# Patient Record
Sex: Female | Born: 1970 | Race: White | Hispanic: No | Marital: Married | Smoking: Never smoker
Health system: Southern US, Community
[De-identification: ages and names within clinical notes are randomized; demographics above are authoritative.]

## PROBLEM LIST (undated history)

## (undated) DIAGNOSIS — Z86718 Personal history of other venous thrombosis and embolism: Secondary | ICD-10-CM

## (undated) DIAGNOSIS — M109 Gout, unspecified: Secondary | ICD-10-CM

## (undated) DIAGNOSIS — I10 Essential (primary) hypertension: Secondary | ICD-10-CM

## (undated) DIAGNOSIS — E079 Disorder of thyroid, unspecified: Secondary | ICD-10-CM

## (undated) DIAGNOSIS — I639 Cerebral infarction, unspecified: Secondary | ICD-10-CM

## (undated) DIAGNOSIS — D649 Anemia, unspecified: Secondary | ICD-10-CM

## (undated) HISTORY — DX: Disorder of thyroid, unspecified: E07.9

## (undated) HISTORY — PX: ESOPHAGOGASTRODUODENOSCOPY: SHX1529

## (undated) HISTORY — DX: Cerebral infarction, unspecified: I63.9

## (undated) HISTORY — DX: Essential (primary) hypertension: I10

## (undated) HISTORY — DX: Anemia, unspecified: D64.9

## (undated) HISTORY — DX: Personal history of other venous thrombosis and embolism: Z86.718

## (undated) HISTORY — PX: COLONOSCOPY: SHX174

---

## 2005-08-07 ENCOUNTER — Ambulatory Visit: Payer: Self-pay | Admitting: Family Medicine

## 2006-11-13 ENCOUNTER — Ambulatory Visit: Payer: Self-pay | Admitting: Family Medicine

## 2007-11-21 ENCOUNTER — Other Ambulatory Visit: Payer: Self-pay

## 2007-11-21 ENCOUNTER — Inpatient Hospital Stay: Payer: Self-pay | Admitting: Internal Medicine

## 2007-11-23 ENCOUNTER — Ambulatory Visit: Payer: Self-pay | Admitting: Oncology

## 2007-12-04 ENCOUNTER — Encounter: Payer: Self-pay | Admitting: Family Medicine

## 2007-12-21 ENCOUNTER — Encounter: Payer: Self-pay | Admitting: Family Medicine

## 2007-12-21 ENCOUNTER — Ambulatory Visit: Payer: Self-pay | Admitting: Oncology

## 2008-03-19 ENCOUNTER — Ambulatory Visit: Payer: Self-pay | Admitting: Family Medicine

## 2008-04-16 ENCOUNTER — Ambulatory Visit: Payer: Self-pay | Admitting: Family Medicine

## 2008-08-07 ENCOUNTER — Emergency Department: Payer: Self-pay | Admitting: Unknown Physician Specialty

## 2009-08-25 ENCOUNTER — Ambulatory Visit: Payer: Self-pay | Admitting: Family Medicine

## 2009-10-28 ENCOUNTER — Emergency Department: Payer: Self-pay | Admitting: Emergency Medicine

## 2011-04-13 ENCOUNTER — Ambulatory Visit: Payer: Self-pay | Admitting: Family Medicine

## 2011-06-28 DIAGNOSIS — Z9229 Personal history of other drug therapy: Secondary | ICD-10-CM | POA: Insufficient documentation

## 2013-06-16 ENCOUNTER — Ambulatory Visit: Payer: Self-pay

## 2013-09-15 DIAGNOSIS — N390 Urinary tract infection, site not specified: Secondary | ICD-10-CM | POA: Insufficient documentation

## 2014-07-02 ENCOUNTER — Encounter: Payer: Self-pay | Admitting: Podiatry

## 2014-07-02 ENCOUNTER — Ambulatory Visit (INDEPENDENT_AMBULATORY_CARE_PROVIDER_SITE_OTHER): Payer: BC Managed Care – PPO

## 2014-07-02 ENCOUNTER — Ambulatory Visit (INDEPENDENT_AMBULATORY_CARE_PROVIDER_SITE_OTHER): Payer: BC Managed Care – PPO | Admitting: Podiatry

## 2014-07-02 VITALS — BP 123/77 | HR 76 | Resp 16 | Ht 62.0 in | Wt 265.0 lb

## 2014-07-02 DIAGNOSIS — M21619 Bunion of unspecified foot: Secondary | ICD-10-CM

## 2014-07-02 DIAGNOSIS — I2699 Other pulmonary embolism without acute cor pulmonale: Secondary | ICD-10-CM | POA: Insufficient documentation

## 2014-07-02 DIAGNOSIS — I639 Cerebral infarction, unspecified: Secondary | ICD-10-CM | POA: Insufficient documentation

## 2014-07-02 DIAGNOSIS — E785 Hyperlipidemia, unspecified: Secondary | ICD-10-CM | POA: Insufficient documentation

## 2014-07-02 DIAGNOSIS — M2021 Hallux rigidus, right foot: Secondary | ICD-10-CM

## 2014-07-02 DIAGNOSIS — M2020 Hallux rigidus, unspecified foot: Secondary | ICD-10-CM

## 2014-07-02 DIAGNOSIS — I10 Essential (primary) hypertension: Secondary | ICD-10-CM | POA: Insufficient documentation

## 2014-07-02 DIAGNOSIS — M21611 Bunion of right foot: Secondary | ICD-10-CM

## 2014-07-02 DIAGNOSIS — E079 Disorder of thyroid, unspecified: Secondary | ICD-10-CM | POA: Insufficient documentation

## 2014-07-02 DIAGNOSIS — M779 Enthesopathy, unspecified: Secondary | ICD-10-CM

## 2014-07-02 MED ORDER — TRIAMCINOLONE ACETONIDE 10 MG/ML IJ SUSP: 10.0000 mg | Freq: Once | INTRAMUSCULAR | Status: DC

## 2014-07-02 NOTE — Progress Notes (Signed)
Subjective:     Patient ID: Caitlyn Elliott, female   DOB: 05-25-1971, 43 y.o.   MRN: [id number]  HPI patient states she's having a lot of pain in the big toe joint of her right foot for 6 weeks and that she caught her toe and her husband shorts and it bent backwards and it's been sore ever since. States that she's had a history of a blood clot in her long 2004 and stroke in 2005 which have resolved and she currently takes Coumadin 5 mg a day to control   Review of Systems  All other systems reviewed and are negative.      Objective:   Physical Exam  Nursing note and vitals reviewed. Constitutional: She is oriented to person, place, and time.  Cardiovascular: Intact distal pulses.   Musculoskeletal: Normal range of motion.  Neurological: She is oriented to person, place, and time.  Skin: Skin is warm.   neurovascular status intact with muscle strength adequate and range of motion subtalar and midtarsal joint within normal limits. Moderate reduction of motion first MPJ right with pain within the joint and inflammation and fluid around mostly the medial side but also the dorsal central and the lateral portion of the joint are sore when pressed. 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 Reappoint in 2 weeks to reevaluate and may require other treatment or possible orthotics

## 2014-07-02 NOTE — Progress Notes (Signed)
   Subjective:    Patient ID: Caitlyn Elliott, female    DOB: 1971/01/19, 43 y.o.   MRN: [id number]  HPI Comments: At the end of July I had a injury to my toe, my toe got caught up on husbands shorts and bent the wrong way ,  and it has not got better. It has its good days and bad days.  Right hallux , it swells throughout the day . It even hurts when not walking. Ice it at night      Review of Systems  Hematological: Bruises/bleeds easily.  All other systems reviewed and are negative.      Objective:   Physical Exam        Assessment & Plan:

## 2014-07-16 ENCOUNTER — Ambulatory Visit (INDEPENDENT_AMBULATORY_CARE_PROVIDER_SITE_OTHER): Payer: BC Managed Care – PPO | Admitting: Podiatry

## 2014-07-16 VITALS — BP 120/74 | HR 84 | Resp 16

## 2014-07-16 DIAGNOSIS — M779 Enthesopathy, unspecified: Secondary | ICD-10-CM

## 2014-07-16 DIAGNOSIS — M21611 Bunion of right foot: Secondary | ICD-10-CM

## 2014-07-16 DIAGNOSIS — M21619 Bunion of unspecified foot: Secondary | ICD-10-CM

## 2014-07-16 NOTE — Progress Notes (Signed)
Subjective:     Patient ID: Caitlyn Elliott, female   DOB: [birth year], 43 y.o.   MRN: [id number]  HPI patient states my right foot is feeling a lot better with mild discomfort with pressure  Review of Systems     Objective:   Physical Exam Neurovascular status unchanged with mild redness around the first metatarsal head right significantly improved from previous    Assessment:     Inflammatory capsulitis around the first MPJ which has responded to conservative care    Plan:     Instructed patient on soaks and wider shoes patient will be seen back as needed

## 2014-08-16 ENCOUNTER — Ambulatory Visit: Payer: Self-pay | Admitting: Family Medicine

## 2014-09-08 ENCOUNTER — Ambulatory Visit: Payer: Self-pay | Admitting: Obstetrics and Gynecology

## 2014-09-08 LAB — CBC WITH DIFFERENTIAL/PLATELET
Basophil #: 0 10*3/uL (ref 0.0–0.1)
Basophil %: 0.5 %
Eosinophil #: 0.1 10*3/uL (ref 0.0–0.7)
Eosinophil %: 0.9 %
HCT: 38.5 % (ref 35.0–47.0)
HGB: 12.2 g/dL (ref 12.0–16.0)
Lymphocyte #: 2.2 10*3/uL (ref 1.0–3.6)
Lymphocyte %: 31.6 %
MCH: 23.5 pg — ABNORMAL LOW (ref 26.0–34.0)
MCHC: 31.6 g/dL — ABNORMAL LOW (ref 32.0–36.0)
MCV: 74 fL — ABNORMAL LOW (ref 80–100)
Monocyte #: 0.5 x10 3/mm (ref 0.2–0.9)
Monocyte %: 7 %
Neutrophil #: 4.1 10*3/uL (ref 1.4–6.5)
Neutrophil %: 60 %
Platelet: 238 10*3/uL (ref 150–440)
RBC: 5.18 10*6/uL (ref 3.80–5.20)
RDW: 19.1 % — ABNORMAL HIGH (ref 11.5–14.5)
WBC: 6.9 10*3/uL (ref 3.6–11.0)

## 2014-09-08 LAB — BASIC METABOLIC PANEL
Anion Gap: 7 (ref 7–16)
BUN: 17 mg/dL (ref 7–18)
Calcium, Total: 8.3 mg/dL — ABNORMAL LOW (ref 8.5–10.1)
Chloride: 103 mmol/L (ref 98–107)
Co2: 30 mmol/L (ref 21–32)
Creatinine: 1.16 mg/dL (ref 0.60–1.30)
EGFR (African American): >60
EGFR (Non-African Amer.): 54 — ABNORMAL LOW
Glucose: 83 mg/dL (ref 65–99)
Osmolality: 280 (ref 275–301)
Potassium: 3.6 mmol/L (ref 3.5–5.1)
Sodium: 140 mmol/L (ref 136–145)

## 2014-09-08 LAB — APTT: Activated PTT: 38.5 secs — ABNORMAL HIGH (ref 23.6–35.9)

## 2014-09-08 LAB — PROTIME-INR
INR: 1.7
Prothrombin Time: 20 secs — ABNORMAL HIGH (ref 11.5–14.7)

## 2014-09-13 ENCOUNTER — Ambulatory Visit: Payer: Self-pay | Admitting: Obstetrics and Gynecology

## 2014-09-13 LAB — PROTIME-INR
INR: 1.3
Prothrombin Time: 15.7 secs — ABNORMAL HIGH (ref 11.5–14.7)

## 2014-09-13 LAB — APTT: Activated PTT: 37 secs — ABNORMAL HIGH (ref 23.6–35.9)

## 2015-02-12 NOTE — Op Note (Signed)
PATIENT NAME:  Caitlyn Elliott, Caitlyn Elliott MR#:  [id number] DATE OF BIRTH:  May 31, 1971  DATE OF PROCEDURE:  09/13/2014  PREOPERATIVE DIAGNOSIS:  Abnormal uterine bleeding, menorrhagia.   POSTOPERATIVE DIAGNOSIS: Abnormal uterine bleeding, menorrhagia.     PROCEDURE:  D and C hysteroscopy with NovaSure ablation.   ANESTHESIA:  General LMA.  SURGEON:  Christeen Douglas, MD  ESTIMATED BLOOD LOSS:  Minimal.  COMPLICATIONS:  None.   FINDINGS:  Normally shaped uterus and cervix, no evident fibroids or polyps in the endometrial cavity with a thin lining.   SPECIMEN TYPES:  ECC and endometrial curettings.   CONDITION: Stable in PACU.    OPERATIVE MEASUREMENTS: Total uterine length 10 cm, cervical length 5.5 cm with a cavity length of 4.5 cm and a cavity with a 4.5 cm.  TOTAL TIME FOR DEVICE ENGAGEMENT: 1 minute and 45 seconds. Power 111.  INDICATION FOR PROCEDURE:  Caitlyn Elliott is a 44 year old gravida 2, para 2, on lifelong anticoagulation for a history of prior unprovoked DVT x 2, who has known uterine fibroids.  She presented with a history of significant menorrhagia limiting her daily activities. She was not a candidate for hormonal therapies.  Other treatment options were discussed with the patient and she desired to proceed with an endometrial ablation and to avoid major surgery if she could.   The patient is maintained on anticoagulation with Coumadin.  Abridging procedure using low molecular weight heparin was discussed with her primary care physician, who manages her INR and she was placed on Lovenox 3 days prior to the start of her procedure.  She will restart her Coumadin in the postoperative period and bridge for 3 days with low molecular weight heparin. On the day of her procedure, her PT/INR were 15.7 and 1.3.  Her preop hemoglobin was 12.2 on 09/08/2014.   PROCEDURE: The patient was brought to the Operating Room, where she was identified by name and birth date and placed on the operating  table in supine position. General anesthesia was induced without difficulty. She was then positioned in a dorsal lithotomy position and prepped and draped in the usual sterile fashion. A formal timeout procedure was performed with all team members present and in agreement.   Her urine was drained for 150 mL of clear yellow urine.  A bivalve speculum was used to visualize the cervix and the anterior lip of the cervix and grasped with a single-tooth tenaculum. Her uterus was sounded to the above dimensions. The cervix was then serially dilated up to a 6 Hegar to incorporate the operative scope.  The hysteroscope was then introduced into the uterus with the findings noticed above and it was felt to be safe to proceed with a NovaSure ablation.   The NovaSure device was then introduced into the uterus and pulled back from the fundus. The NovaSure ablation device was deployed with the above width finding.  The ablation cycle proceeded without difficulty.   Prior to the introduction of the NovaSure device, endometrial curettings were obtained and this specimen was sent off to pathology.   The single-tooth tenaculum was removed from the anterior lip of the cervix, pressure was held to achieve hemostasis, which was very easy to achieve. Because of her anticoagulated status visualization of the anterior lip of the cervix and external cervical os were monitored for a few more minutes just to assure that bleeding was under control and she was hemostatic.   The general anesthesia was reversed without difficulty and the patient was taken  the waiting room in stable condition.      ____________________________ Cline CoolsBethany E. Mohogany Toppins, MD beb:DT D: 09/13/2014 09:23:11 ET T: 09/13/2014 10:38:53 ET JOB#: [id number]  cc: Cline CoolsBethany E. Quanita Barona, MD, <Dictator> Cline CoolsBETHANY E Levenia Skalicky MD ELECTRONICALLY SIGNED 09/30/2014 10:56

## 2015-02-14 LAB — SURGICAL PATHOLOGY

## 2015-05-02 ENCOUNTER — Other Ambulatory Visit: Payer: Self-pay | Admitting: Family Medicine

## 2015-05-02 DIAGNOSIS — Z1231 Encounter for screening mammogram for malignant neoplasm of breast: Secondary | ICD-10-CM

## 2015-05-03 ENCOUNTER — Ambulatory Visit
Admission: RE | Admit: 2015-05-03 | Discharge: 2015-05-03 | Disposition: A | Discharge status: Home / Self Care | Payer: BC Managed Care – PPO | Source: Ambulatory Visit | Attending: Family Medicine | Admitting: Family Medicine

## 2015-05-03 DIAGNOSIS — Z1231 Encounter for screening mammogram for malignant neoplasm of breast: Secondary | ICD-10-CM

## 2016-05-24 ENCOUNTER — Ambulatory Visit: Payer: Self-pay | Admitting: Urology

## 2016-07-25 ENCOUNTER — Ambulatory Visit (INDEPENDENT_AMBULATORY_CARE_PROVIDER_SITE_OTHER): Payer: BC Managed Care – PPO | Admitting: Podiatry

## 2016-07-25 ENCOUNTER — Ambulatory Visit (INDEPENDENT_AMBULATORY_CARE_PROVIDER_SITE_OTHER): Payer: BC Managed Care – PPO

## 2016-07-25 ENCOUNTER — Encounter: Payer: Self-pay | Admitting: Podiatry

## 2016-07-25 VITALS — BP 114/72 | HR 86 | Resp 16

## 2016-07-25 DIAGNOSIS — M779 Enthesopathy, unspecified: Secondary | ICD-10-CM

## 2016-07-25 DIAGNOSIS — M79671 Pain in right foot: Secondary | ICD-10-CM

## 2016-07-25 DIAGNOSIS — M21611 Bunion of right foot: Secondary | ICD-10-CM

## 2016-07-25 DIAGNOSIS — M1000 Idiopathic gout, unspecified site: Secondary | ICD-10-CM | POA: Diagnosis not present

## 2016-07-25 LAB — URIC ACID: Uric Acid, Serum: 7.8 mg/dL — ABNORMAL HIGH (ref 2.5–7.0)

## 2016-07-25 MED ORDER — TRIAMCINOLONE ACETONIDE 10 MG/ML IJ SUSP
10.0000 mg | Freq: Once | INTRAMUSCULAR | Status: AC
  Administered 2016-07-25: 10 mg

## 2016-07-25 MED ORDER — METHYLPREDNISOLONE 4 MG PO TBPK: ORAL_TABLET | ORAL | 0 refills | Status: DC

## 2016-07-25 NOTE — Patient Instructions (Signed)

## 2016-07-26 LAB — ANA, IFA COMPREHENSIVE PANEL
Anti Nuclear Antibody(ANA): POSITIVE — AB
ENA SM Ab Ser-aCnc: <1
SM/RNP: <1
SSA (Ro) (ENA) Antibody, IgG: <1
SSB (La) (ENA) Antibody, IgG: <1
Scleroderma (Scl-70) (ENA) Antibody, IgG: 2.5 — ABNORMAL HIGH
ds DNA Ab: <1 IU/mL

## 2016-07-26 LAB — ANTI-NUCLEAR AB-TITER (ANA TITER): ANA Titer 1: 1:160 {titer} — ABNORMAL HIGH

## 2016-07-26 LAB — C-REACTIVE PROTEIN: CRP: 19.7 mg/L — ABNORMAL HIGH (ref <8.0)

## 2016-07-26 LAB — SEDIMENTATION RATE: Sed Rate: 17 mm/hr (ref 0–20)

## 2016-07-26 LAB — RHEUMATOID FACTOR: Rhuematoid fact SerPl-aCnc: <14 IU/mL (ref <14)

## 2016-07-26 NOTE — Progress Notes (Signed)
Subjective:     Patient ID: Caitlyn Elliott, female   DOB: 12/24/1970, 45 y.o.   MRN: [id number]  HPI patient presents stating she's developed a lot of pain around her right foot on top and on the bottom and it seems like it just came up in the last few days   Review of Systems     Objective:   Physical Exam Neurovascular status unchanged with discomfort around the right first MPJ dorsal and also around the right plantar capsule with inflammation and fluid in these 2 distinct areas    Assessment:     Possibility for systemic gout versus inflammatory condition with hallux limitus deformity also noted    Plan:     H&P both conditions reviewed and injected the dorsal capsule 3 mg Kenalog 5 mill grams Xylocaine the plantar capsule 3 mg Kenalog 5 mg Xylocaine advised on diet modifications gave out information about gout and sent for blood work to rule out pathology. Reappoint to recheck we get pathology back  X-ray indicates that there is some lysis or other first metatarsal with no indications of advanced arthritis or stress fracture

## 2016-07-27 ENCOUNTER — Telehealth: Payer: Self-pay | Admitting: *Deleted

## 2016-07-27 NOTE — Telephone Encounter (Signed)
Dr. Charlsie Merlesegal reviewed 07/25/2016 arthritic panel, states pt needs an appt. Left message instructing pt to call for an appt.

## 2016-08-02 ENCOUNTER — Ambulatory Visit (INDEPENDENT_AMBULATORY_CARE_PROVIDER_SITE_OTHER): Payer: BC Managed Care – PPO | Admitting: Podiatry

## 2016-08-02 ENCOUNTER — Encounter: Payer: Self-pay | Admitting: Podiatry

## 2016-08-02 DIAGNOSIS — M1000 Idiopathic gout, unspecified site: Secondary | ICD-10-CM | POA: Diagnosis not present

## 2016-08-02 DIAGNOSIS — M779 Enthesopathy, unspecified: Secondary | ICD-10-CM | POA: Diagnosis not present

## 2016-08-02 DIAGNOSIS — M21611 Bunion of right foot: Secondary | ICD-10-CM

## 2016-08-02 NOTE — Progress Notes (Signed)
Subjective:     Patient ID: Caitlyn Elliott, female   DOB: 08/29/1971, 45 y.o.   MRN: [id number]  HPI patient presents stating that her foot is feeling quite a bit better but she has also had this in her thumb and is had periodic incidence of this recurrent   Review of Systems     Objective:   Physical Exam Neurovascular status intact with patient found to have significant diminishment of discomfort around the first MPJ right with abnormal blood work note    Assessment:     Possibility for gout versus some kind of systemic arthritis condition or combination based on blood work with inflammatory processes that have improved    Plan:     Reviewed condition and recommended rheumatology and she will go through her family physician and today I gave her blood work spent a great of time going over the results of blood work with her and the indications that there is more than just gout that occurring with her

## 2017-03-22 DIAGNOSIS — M109 Gout, unspecified: Secondary | ICD-10-CM

## 2017-03-22 HISTORY — DX: Gout, unspecified: M10.9

## 2017-09-03 ENCOUNTER — Observation Stay
Admission: EM | Admit: 2017-09-03 | Discharge: 2017-09-04 | Disposition: A | Discharge status: Home / Self Care | Payer: BC Managed Care – PPO | Attending: Internal Medicine | Admitting: Internal Medicine

## 2017-09-03 ENCOUNTER — Other Ambulatory Visit: Payer: Self-pay

## 2017-09-03 ENCOUNTER — Emergency Department: Payer: BC Managed Care – PPO

## 2017-09-03 DIAGNOSIS — G9389 Other specified disorders of brain: Secondary | ICD-10-CM | POA: Diagnosis not present

## 2017-09-03 DIAGNOSIS — H539 Unspecified visual disturbance: Secondary | ICD-10-CM | POA: Diagnosis not present

## 2017-09-03 DIAGNOSIS — R4701 Aphasia: Secondary | ICD-10-CM | POA: Diagnosis present

## 2017-09-03 DIAGNOSIS — Z8673 Personal history of transient ischemic attack (TIA), and cerebral infarction without residual deficits: Secondary | ICD-10-CM | POA: Diagnosis not present

## 2017-09-03 DIAGNOSIS — Z888 Allergy status to other drugs, medicaments and biological substances status: Secondary | ICD-10-CM | POA: Diagnosis not present

## 2017-09-03 DIAGNOSIS — Z7901 Long term (current) use of anticoagulants: Secondary | ICD-10-CM | POA: Insufficient documentation

## 2017-09-03 DIAGNOSIS — R791 Abnormal coagulation profile: Secondary | ICD-10-CM | POA: Insufficient documentation

## 2017-09-03 DIAGNOSIS — E079 Disorder of thyroid, unspecified: Secondary | ICD-10-CM | POA: Insufficient documentation

## 2017-09-03 DIAGNOSIS — I2699 Other pulmonary embolism without acute cor pulmonale: Secondary | ICD-10-CM | POA: Diagnosis not present

## 2017-09-03 DIAGNOSIS — I10 Essential (primary) hypertension: Secondary | ICD-10-CM | POA: Diagnosis present

## 2017-09-03 DIAGNOSIS — Z86711 Personal history of pulmonary embolism: Secondary | ICD-10-CM | POA: Diagnosis not present

## 2017-09-03 DIAGNOSIS — M109 Gout, unspecified: Secondary | ICD-10-CM | POA: Insufficient documentation

## 2017-09-03 DIAGNOSIS — E785 Hyperlipidemia, unspecified: Secondary | ICD-10-CM | POA: Diagnosis present

## 2017-09-03 DIAGNOSIS — I639 Cerebral infarction, unspecified: Secondary | ICD-10-CM | POA: Diagnosis present

## 2017-09-03 DIAGNOSIS — Z9229 Personal history of other drug therapy: Secondary | ICD-10-CM

## 2017-09-03 DIAGNOSIS — Z79890 Hormone replacement therapy (postmenopausal): Secondary | ICD-10-CM | POA: Diagnosis not present

## 2017-09-03 DIAGNOSIS — Z86718 Personal history of other venous thrombosis and embolism: Secondary | ICD-10-CM | POA: Insufficient documentation

## 2017-09-03 DIAGNOSIS — Z79899 Other long term (current) drug therapy: Secondary | ICD-10-CM | POA: Insufficient documentation

## 2017-09-03 HISTORY — DX: Gout, unspecified: M10.9

## 2017-09-03 LAB — DIFFERENTIAL
Basophils Absolute: 0.1 10*3/uL (ref 0–0.1)
Basophils Relative: 1 %
Eosinophils Absolute: 0 10*3/uL (ref 0–0.7)
Eosinophils Relative: 1 %
Lymphocytes Relative: 23 %
Lymphs Abs: 1.7 10*3/uL (ref 1.0–3.6)
Monocytes Absolute: 0.6 10*3/uL (ref 0.2–0.9)
Monocytes Relative: 9 %
Neutro Abs: 4.8 10*3/uL (ref 1.4–6.5)
Neutrophils Relative %: 66 %

## 2017-09-03 LAB — COMPREHENSIVE METABOLIC PANEL
ALT: 17 U/L (ref 14–54)
AST: 24 U/L (ref 15–41)
Albumin: 4 g/dL (ref 3.5–5.0)
Alkaline Phosphatase: 62 U/L (ref 38–126)
Anion gap: 11 (ref 5–15)
BUN: 21 mg/dL — ABNORMAL HIGH (ref 6–20)
CO2: 24 mmol/L (ref 22–32)
Calcium: 9.2 mg/dL (ref 8.9–10.3)
Chloride: 100 mmol/L — ABNORMAL LOW (ref 101–111)
Creatinine, Ser: 1.09 mg/dL — ABNORMAL HIGH (ref 0.44–1.00)
GFR calc Af Amer: >60 mL/min (ref >60)
GFR calc non Af Amer: 59 mL/min — ABNORMAL LOW (ref >60)
Glucose, Bld: 125 mg/dL — ABNORMAL HIGH (ref 65–99)
Potassium: 3.3 mmol/L — ABNORMAL LOW (ref 3.5–5.1)
Sodium: 135 mmol/L (ref 135–145)
Total Bilirubin: 0.9 mg/dL (ref 0.3–1.2)
Total Protein: 7.6 g/dL (ref 6.5–8.1)

## 2017-09-03 LAB — TROPONIN I: Troponin I: <0.03 ng/mL (ref <0.03)

## 2017-09-03 LAB — CBC
HCT: 43.1 % (ref 35.0–47.0)
Hemoglobin: 14.5 g/dL (ref 12.0–16.0)
MCH: 30.1 pg (ref 26.0–34.0)
MCHC: 33.6 g/dL (ref 32.0–36.0)
MCV: 89.5 fL (ref 80.0–100.0)
Platelets: 234 10*3/uL (ref 150–440)
RBC: 4.82 MIL/uL (ref 3.80–5.20)
RDW: 20.4 % — ABNORMAL HIGH (ref 11.5–14.5)
WBC: 7.2 10*3/uL (ref 3.6–11.0)

## 2017-09-03 LAB — ETHANOL: Alcohol, Ethyl (B): <10 mg/dL (ref <10)

## 2017-09-03 LAB — PROTIME-INR
INR: 1.74
Prothrombin Time: 20.2 seconds — ABNORMAL HIGH (ref 11.4–15.2)

## 2017-09-03 LAB — APTT: aPTT: 38 seconds — ABNORMAL HIGH (ref 24–36)

## 2017-09-03 MED ORDER — ASPIRIN 81 MG PO CHEW
324.0000 mg | CHEWABLE_TABLET | Freq: Once | ORAL | Status: AC
  Administered 2017-09-03: 324 mg via ORAL
  Filled 2017-09-03: qty 4

## 2017-09-03 MED ORDER — ACETAMINOPHEN 650 MG RE SUPP: 650.0000 mg | RECTAL | Status: DC | PRN

## 2017-09-03 MED ORDER — SODIUM CHLORIDE 0.9 % IV SOLN
INTRAVENOUS | Status: DC
  Administered 2017-09-03: 23:00:00 via INTRAVENOUS

## 2017-09-03 MED ORDER — STROKE: EARLY STAGES OF RECOVERY BOOK
Freq: Once | Status: AC
  Administered 2017-09-03

## 2017-09-03 MED ORDER — ACETAMINOPHEN 160 MG/5ML PO SOLN: 650.0000 mg | ORAL | Status: DC | PRN

## 2017-09-03 MED ORDER — LEVOTHYROXINE SODIUM 112 MCG PO TABS
112.0000 ug | ORAL_TABLET | Freq: Every day | ORAL | Status: DC
  Administered 2017-09-04: 112 ug via ORAL
  Filled 2017-09-03: qty 1

## 2017-09-03 MED ORDER — ACETAMINOPHEN 325 MG PO TABS: 650.0000 mg | ORAL_TABLET | ORAL | Status: DC | PRN

## 2017-09-03 NOTE — H&P (Signed)
[facility] [place] at [facility]   PATIENT NAME: Caitlyn Elliott    MR#:  [id number]  DATE OF BIRTH:  1971/08/21  DATE OF ADMISSION:  09/03/2017  PRIMARY CARE PHYSICIAN: Leim Fabry, MD   REQUESTING/REFERRING PHYSICIAN: Hanley Ben, MD  CHIEF COMPLAINT:   Chief Complaint  Patient presents with  . Weakness    HISTORY OF PRESENT ILLNESS:  Caitlyn Elliott  is a 46 y.o. female who presents with an episode of a aphasia and left-sided sensory deficit and weakness.  Patient has a history of prior stroke after which she had some residual left-sided deficits, though she stated that they recovered nearly completely over time leaving only some mild distal left arm sensory deficit.  Tonight she had two episodes of word finding difficulty.  She then had some left-sided weakness and increased sensory deficit on that side.  This was all followed by left-sided neck pain and headache.  Here in the ED her symptoms have mostly resolved except for some mild persistent increase in her left upper extremity sensory deficit.  Hospitalist were called for admission and evaluation for possible stroke  PAST MEDICAL HISTORY:   Past Medical History:  Diagnosis Date  . Gout 03/2017  . History of blood clots   . Hypertension   . Stroke (HCC)   . Thyroid disease     PAST SURGICAL HISTORY:  History reviewed. No pertinent surgical history.  SOCIAL HISTORY:   Social History   Tobacco Use  . Smoking status: Never Smoker  . Smokeless tobacco: Never Used  Substance Use Topics  . Alcohol use: Not on file    FAMILY HISTORY:  No family history on file.  DRUG ALLERGIES:   Allergies  Allergen Reactions  . Iron Rash    Slow FE iron OTC [facility] brand    MEDICATIONS AT HOME:   Prior to Admission medications   Medication Sig Start Date End Date Taking? Authorizing Provider  allopurinol (ZYLOPRIM) 300 MG tablet Take 300 mg daily by mouth.   Yes [provider]   amoxicillin-clavulanate (AUGMENTIN) 875-125 MG tablet Take 1 tablet 2 (two) times daily by mouth.   Yes [provider]  trimethoprim-polymyxin b (POLYTRIM) ophthalmic solution Place 1 drop every 3 (three) hours into the right eye. For 7 days - max 6 doses in 24 hours 08/25/17 09/03/17 Yes [provider]  warfarin (COUMADIN) 2.5 MG tablet Take 2.5 mg daily by mouth.   Yes [provider]  warfarin (COUMADIN) 5 MG tablet Take 5 mg daily by mouth.  09/15/13  Yes [provider]  levothyroxine (SYNTHROID, LEVOTHROID) 112 MCG tablet TAKE 1 TABLET BY MOUTH ONCE DAILY 02/04/13   [provider]  triamterene-hydrochlorothiazide (DYAZIDE) 37.5-25 MG per capsule Take by mouth. 09/15/13   [provider]    REVIEW OF SYSTEMS:  Review of Systems  Constitutional: Negative for chills, fever, malaise/fatigue and weight loss.  HENT: Negative for ear pain, hearing loss and tinnitus.   Eyes: Negative for blurred vision, double vision, pain and redness.  Respiratory: Negative for cough, hemoptysis and shortness of breath.   Cardiovascular: Negative for chest pain, palpitations, orthopnea and leg swelling.  Gastrointestinal: Negative for abdominal pain, constipation, diarrhea, nausea and vomiting.  Genitourinary: Negative for dysuria, frequency and hematuria.  Musculoskeletal: Negative for back pain, joint pain and neck pain.  Skin:       No acne, rash, or lesions  Neurological: Positive for sensory change, speech change and focal weakness. Negative for  dizziness, tremors and weakness.  Endo/Heme/Allergies: Negative for polydipsia. Does not bruise/bleed easily.  Psychiatric/Behavioral: Negative for depression. The patient is not nervous/anxious and does not have insomnia.      VITAL SIGNS:   Vitals:   09/03/17 2027 09/03/17 2100 09/03/17 2115 09/03/17 2119  BP:  (!) 117/56 (!) 130/56   Pulse:  90 90   Resp:  (!) 22 (!) 22   Temp:    98.2 F (36.8  C)  TempSrc:      SpO2:  99% 100%   Weight: 120.2 kg (265 lb)     Height: 5\' 5"  (1.651 m)      Wt Readings from Last 3 Encounters:  09/03/17 120.2 kg (265 lb)  07/02/14 120.2 kg (265 lb)    PHYSICAL EXAMINATION:  Physical Exam  Vitals reviewed. Constitutional: She is oriented to person, place, and time. She appears well-developed and well-nourished. No distress.  HENT:  Head: Normocephalic and atraumatic.  Mouth/Throat: Oropharynx is clear and moist.  Eyes: Conjunctivae and EOM are normal. Pupils are equal, round, and reactive to light. No scleral icterus.  Neck: Normal range of motion. Neck supple. No JVD present. No thyromegaly present.  Cardiovascular: Normal rate, regular rhythm and intact distal pulses. Exam reveals no gallop and no friction rub.  No murmur heard. Respiratory: Effort normal and breath sounds normal. No respiratory distress. She has no wheezes. She has no rales.  GI: Soft. Bowel sounds are normal. She exhibits no distension. There is no tenderness.  Musculoskeletal: Normal range of motion. She exhibits no edema.  No arthritis, no gout  Lymphadenopathy:    She has no cervical adenopathy.  Neurological: She is alert and oriented to person, place, and time. No cranial nerve deficit.  Neurologic: Cranial nerves II-XII intact, Sensation intact to light touch/pinprick except for decreased sensation to light touch in her left upper extremity, 5/5 strength in all extremities, no dysarthria, no aphasia, no dysphagia, memory intact, no pronator drift  Skin: Skin is warm and dry. No rash noted. No erythema.  Psychiatric: She has a normal mood and affect. Her behavior is normal. Judgment and thought content normal.    LABORATORY PANEL:   CBC Recent Labs  Lab 09/03/17 2049  WBC 7.2  HGB 14.5  HCT 43.1  PLT 234   ------------------------------------------------------------------------------------------------------------------  Chemistries  Recent Labs  Lab  09/03/17 2049  NA 135  K 3.3*  CL 100*  CO2 24  GLUCOSE 125*  BUN 21*  CREATININE 1.09*  CALCIUM 9.2  AST 24  ALT 17  ALKPHOS 62  BILITOT 0.9   ------------------------------------------------------------------------------------------------------------------  Cardiac Enzymes Recent Labs  Lab 09/03/17 2049  TROPONINI <0.03   ------------------------------------------------------------------------------------------------------------------  RADIOLOGY:  Ct Head Code Stroke Wo Contrast  Result Date: 09/03/2017 CLINICAL DATA:  Code stroke. Sudden onset of blurred vision and slurred speech today. EXAM: CT HEAD WITHOUT CONTRAST TECHNIQUE: Contiguous axial images were obtained from the base of the skull through the vertex without intravenous contrast. COMPARISON:  MRI 11/25/2007.  CT 11/21/2007. FINDINGS: Brain: No acute finding today. No sign of acute infarction, mass lesion, hemorrhage, hydrocephalus or extra-axial collection. Mild focal atrophy at the right posterior frontal vertex. Remarkably little sequela to the old right posterior frontal hemorrhagic infarction that occurred in 2009. Vascular: No abnormal vascular finding. Skull: Normal Sinuses/Orbits: Clear/normal Other: None ASPECTS (Alberta Stroke Program Early CT Score) - Ganglionic level infarction (caudate, lentiform nuclei, internal capsule, insula, M1-M3 cortex): 7 - Supraganglionic infarction (M4-M6 cortex): 3 Total score (0-10  with 10 being normal): 10 IMPRESSION: 1. No acute finding. Mild atrophy at the right posterior frontal vertex were there was a hemorrhagic infarction in 2009. 2. ASPECTS is 10 These results were called by telephone at the time of interpretation on 09/03/2017 at 8:55 pm to Dr. Gladstone PihAVID SCHAEVITZ , who verbally acknowledged these results. Electronically Signed   By: Paulina FusiMark  Shogry M.D.   On: 09/03/2017 20:58    EKG:   Orders placed or performed during the hospital encounter of 09/03/17  . EKG 12-Lead  . EKG  12-Lead    IMPRESSION AND PLAN:  Principal Problem:   Aphasia -has resolved now, however patient was recommended by telemetry neurology to have full stroke workup.  Admitted per stroke workup order set with appropriate imaging, consults, labs. Active Problems:   HTN (hypertension) -permissive hypertension for the next 24 hours, blood pressure goal less than 220/120   HLD (hyperlipidemia) -continue home meds   History of anticoagulant therapy -continue warfarin therapeutic dose   Disease of thyroid gland -home dose thyroid replacement  All the records are reviewed and case discussed with ED provider. Management plans discussed with the patient and/or family.  DVT PROPHYLAXIS: Systemic anticoagulation  GI PROPHYLAXIS: None  ADMISSION STATUS: Observation  CODE STATUS: Full Code Status History    This patient does not have a recorded code status. Please follow your organizational policy for patients in this situation.      TOTAL TIME TAKING CARE OF THIS PATIENT: 40 minutes.   Caitlyn Elliott FIELDING 09/03/2017, 10:10 PM  [facility]  Office  [phone]  CC: Primary care physician; Leim FabryAldridge, Barbara, MD  Note:  This document was prepared using Dragon voice recognition software and may include unintentional dictation errors.

## 2017-09-03 NOTE — Consult Note (Signed)
   [facility] Tele[facility]urology Consult Services  Impression: Possible new Stroke  Could be recrudescence of prior stroke symptoms but given her history, favor neuro checks and stroke evaluation w/u.  Not a [facility] candidate [facility].7 and PT 20.2), rapidly improving Not an NIR candidate due to: no current aphasia, gaze deviation, VFD or neglect, EVT would not be indicated.   Comments:   [facility] contacted: 2101 [facility] at bedside: 2106 NIHSS assessment time: 2106  Recommendations:   Continue Coumadin as prior  PT OT Evals Stroke protocol w/u  Further inpatient evaluation as per Neurology/ Internal Medicine Discussed with ED MD  History of Present Illness   Patient is a 46 yo F with h/o PE 2004 and prior stroke with hemorrhagic conversion 2009 On coumadin, last inr 2.0 last Wednesday She p/w 5 min of word finding trouble. This is a similar symptom to her prior stroke.  Pt was at dinner with family when the word finding trouble began.  Symtpoms rapidly improved and now she is nearly normal.  She has residual left sided numbness from her prior stroke.  Her language and speech is normal to my exam but patient subjectively feels as though her speech is slower than usual.  HCT is negative for bleedin g  Exam   NIHSS score:1 (baseline) 1A: Level of Consciousness - Alert; keenly responsive  1B: Ask Month and Age - Both Questions Right  1C: 'Blink Eyes' & 'Squeeze Hands' - Performs Both Tasks  2: Test Horizontal Extraocular Movements - Normal  3: Test Visual Fields - No Visual Loss  4: Test Facial Palsy - Normal symmetry 5A: Test Left Arm Motor Drift - No Drift for 10 Seconds  5B: Test Right Arm Motor Drift - No Drift for 10 Seconds 6A: Test Left Leg Motor Drift - No Drift for 5 Seconds  6B: Test Right Leg Motor Drift - No Drift for 5 Seconds  7: Test Limb Ataxia - No Ataxia  8: Test Sensation - Mild-Moderate Loss: Less Sharp/More Dull  9: Test  Language/Aphasia - Normal; No aphasia  10: Test Dysarthria - Normal  11: Test Extinction/Inattention - No abnormality   Medical Decision Making:  - Extensive number of diagnosis or management options are considered above.   - Extensive amount of complex data reviewed.   - High risk of complication and/or morbidity or mortality are associated with differential diagnostic considerations above.  - There may be Uncertain outcome and increased probability of prolonged functional impairment or high probability of severe prolonged functional impairment associated with some of these differential diagnosis.  Medical Data Reviewed:  1.Data reviewed include clinical labs, radiology,  Medical Tests;   2.Tests results discussed w/performing or interpreting physician;   3.Obtaining/reviewing old medical records;  4.Obtaining case history from another source;  5.Independent review of image, tracing or specimen.

## 2017-09-03 NOTE — ED Notes (Signed)
MD at bedside and teleneuro plugged in, paged and RN has answered call already.

## 2017-09-03 NOTE — ED Triage Notes (Signed)
Pt presents to ED via private auto with c/o aphasia and left sided weakness while eating dinner approx an hour ago. Pt daughter was present at the onset of her symptoms and expresses her concern due to pt previous CVA in 2009. Pt currently has clear speech and no obvious deficits. Pt states symptoms have resolved at this time aside from "tightness" in the left side of her neck. Denies sob or chest pain. Answering questions without difficulty.

## 2017-09-03 NOTE — ED Provider Notes (Signed)
[facility] Emergency Department Provider Note  ____________________________________________   First MD Initiated Contact with Patient 09/03/17 2047     (approximate)  I have reviewed the triage vital signs and the nursing notes.   HISTORY  Chief Complaint Weakness   HPI Caitlyn Elliott is a 46 y.o. female with a history of pulmonary embolus as well as stroke was presented to the emergency department today with a aphasia, right-sided visual disturbance as well as left-sided weakness and left-sided neck pain and headache.  She is here with her daughter who is a nurse who says that the patient's symptoms have resolved except for persistent "slow speech."  However, the patient is still saying that she is having a dull left-sided neck pain as well as left-sided temporal pain which is a 3 out of 10 and started at about 8 PM on the way to the hospital.  She says the last time she felt within normal was 7:30 PM.  The patient is on Coumadin for history of pulmonary embolus.  Says that she had similar symptoms with her stroke in 2009.   Past Medical History:  Diagnosis Date  . Gout 03/2017  . History of blood clots   . Hypertension   . Stroke (HCC)   . Thyroid disease     Patient Active Problem List   Diagnosis Date Noted  . HLD (hyperlipidemia) 07/02/2014  . BP (high blood pressure) 07/02/2014  . PE (pulmonary embolism) 07/02/2014  . Cerebral vascular accident (HCC) 07/02/2014  . Disease of thyroid gland 07/02/2014  . Lower urinary tract infection 09/15/2013  . History of anticoagulant therapy 06/28/2011    History reviewed. No pertinent surgical history.  Prior to Admission medications   Medication Sig Start Date End Date Taking? Authorizing Provider  allopurinol (ZYLOPRIM) 300 MG tablet Take 300 mg daily by mouth.   Yes [provider]  amoxicillin-clavulanate (AUGMENTIN) 875-125 MG tablet Take 1 tablet 2 (two) times daily by mouth.   Yes  [provider]  trimethoprim-polymyxin b (POLYTRIM) ophthalmic solution Place 1 drop every 3 (three) hours into the right eye. For 7 days - max 6 doses in 24 hours 08/25/17 09/03/17 Yes [provider]  warfarin (COUMADIN) 2.5 MG tablet Take 2.5 mg daily by mouth.   Yes [provider]  warfarin (COUMADIN) 5 MG tablet Take 5 mg daily by mouth.  09/15/13  Yes [provider]  levothyroxine (SYNTHROID, LEVOTHROID) 112 MCG tablet TAKE 1 TABLET BY MOUTH ONCE DAILY 02/04/13   [provider]  triamterene-hydrochlorothiazide (DYAZIDE) 37.5-25 MG per capsule Take by mouth. 09/15/13   [provider]    Allergies Iron  No family history on file.  Social History Social History   Tobacco Use  . Smoking status: Never Smoker  . Smokeless tobacco: Never Used  Substance Use Topics  . Alcohol use: Not on file  . Drug use: Not on file    Review of Systems  Constitutional: No fever/chills Eyes: No visual changes. ENT: No sore throat. Cardiovascular: Denies chest pain. Respiratory: Denies shortness of breath. Gastrointestinal: No abdominal pain.  No nausea, no vomiting.  No diarrhea.  No constipation. Genitourinary: Negative for dysuria. Musculoskeletal: Negative for back pain. Skin: Negative for rash. Neurological: As above   ____________________________________________   PHYSICAL EXAM:  VITAL SIGNS: ED Triage Vitals  Enc Vitals Group     BP 09/03/17 2026 (!) 146/80     Pulse Rate 09/03/17 2026 94  Resp 09/03/17 2026 18     Temp 09/03/17 2026 98.7 F (37.1 C)     Temp Source 09/03/17 2026 Oral     SpO2 09/03/17 2026 100 %     Weight 09/03/17 2027 265 lb (120.2 kg)     Height 09/03/17 2027 5\' 5"  (1.651 m)     Head Circumference --      Peak Flow --      Pain Score 09/03/17 2028 4     Pain Loc --      Pain Edu? --      Excl. in GC? --     Constitutional: Alert and oriented. Well appearing and in no acute  distress. Eyes: Conjunctivae are normal.  Head: Atraumatic. Nose: No congestion/rhinnorhea. Mouth/Throat: Mucous membranes are moist.  Neck: No stridor.  Mild tenderness to palpation just inferior to the left mastoid process without any mass palpable. Cardiovascular: Normal rate, regular rhythm. Grossly normal heart sounds.   Respiratory: Normal respiratory effort.  No retractions. Lungs CTAB. Gastrointestinal: Soft and nontender. No distention. Musculoskeletal: No lower extremity tenderness nor edema.  No joint effusions. Neurologic:  Normal speech and language. No gross focal neurologic deficits are appreciated. Skin:  Skin is warm, dry and intact. No rash noted. Psychiatric: Mood and affect are normal. Speech and behavior are normal.   NIH Stroke Scale   Person Administering Scale: Arelia LongestSchaevitz,  Vaneta Hammontree M  Administer stroke scale items in the order listed. Record performance in each category after each subscale exam. Do not go back and change scores. Follow directions provided for each exam technique. Scores should reflect what the patient does, not what the clinician thinks the patient can do. The clinician should record answers while administering the exam and work quickly. Except where indicated, the patient should not be coached (i.e., repeated requests to patient to make a special effort).   1a  Level of consciousness: 0=alert; keenly responsive  1b. LOC questions:  0=Performs both tasks correctly  1c. LOC commands: 0=Performs both tasks correctly  2.  Best Gaze: 0=normal  3.  Visual: 0=No visual loss  4. Facial Palsy: 0=Normal symmetric movement  5a.  Motor left arm: 0=No drift, limb holds 90 (or 45) degrees for full 10 seconds  5b.  Motor right arm: 0=No drift, limb holds 90 (or 45) degrees for full 10 seconds  6a. motor left leg: 0=No drift, limb holds 90 (or 45) degrees for full 10 seconds  6b  Motor right leg:  0=No drift, limb holds 90 (or 45) degrees for full 10 seconds   7. Limb Ataxia: 0=Absent  8.  Sensory: 0=Normal; no sensory loss  9. Best Language:  0=No aphasia, normal  10. Dysarthria: 0=Normal  11. Extinction and Inattention: 0=No abnormality  12. Distal motor function: 0=Normal   Total:   0    ____________________________________________   LABS (all labs ordered are listed, but only abnormal results are displayed)  Labs Reviewed  PROTIME-INR - Abnormal; Notable for the following components:      Result Value   Prothrombin Time 20.2 (*)    All other components within normal limits  APTT - Abnormal; Notable for the following components:   aPTT 38 (*)    All other components within normal limits  CBC - Abnormal; Notable for the following components:   RDW 20.4 (*)    All other components within normal limits  ETHANOL  DIFFERENTIAL  COMPREHENSIVE METABOLIC PANEL  TROPONIN I  URINE DRUG SCREEN, QUALITATIVE ([facility] ONLY)  URINALYSIS, ROUTINE W REFLEX MICROSCOPIC  POC URINE PREG, ED   ____________________________________________  EKG  ED ECG REPORT I, Lael Wetherbee,  Teena Irani, the attending physician, personally viewed and interpreted this ECG.   Date: 09/03/2017  EKG Time: 2037  Rate: 87  Rhythm: normal sinus rhythm  Axis: Normal  Intervals:none  ST&T Change: No ST segment elevation or depression.  No abnormal T wave inversion.  ____________________________________________  RADIOLOGY  No acute findings on today's CAT scan.  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 ____________________________________________   PROCEDURES  Procedure(s) performed:   Procedures  Critical Care performed:   ____________________________________________   INITIAL IMPRESSION / ASSESSMENT AND PLAN / ED COURSE  Pertinent labs & imaging results that were available during my care of the patient were reviewed by me and considered in my medical decision making (see chart for  details).  Stroke alert called upon arrival to the emergency department as the patient is within the window for TPA.  DDX: Embolic stroke, complicated migraine, hemorrhagic stroke, posterior circulation defect  As part of my medical decision making, I reviewed the following data within the electronic MEDICAL RECORD NUMBER Old chart reviewed   ----------------------------------------- 9:36 PM on 09/03/2017 -----------------------------------------  Discussed the case with the specialists on-call neurologist, Dr. Tonna Boehringer.  Dr. Tonna Boehringer recommends admission to the hospital but does not recommend TPA at this time.  Patient with past hemorrhagic conversion as well as rapid improvement tonight.  Family is aware of plan for admission to the hospital.  Signed out to Dr. Anne Hahn, hospitalist.     ____________________________________________   FINAL CLINICAL IMPRESSION(S) / ED DIAGNOSES  CVA.    NEW MEDICATIONS STARTED DURING THIS VISIT:  This SmartLink is deprecated. Use AVSMEDLIST instead to display the medication list for a patient.   Note:  This document was prepared using Dragon voice recognition software and may include unintentional dictation errors.     Myrna Blazer, MD 09/03/17 2139

## 2017-09-04 ENCOUNTER — Observation Stay: Payer: BC Managed Care – PPO

## 2017-09-04 ENCOUNTER — Observation Stay
Admit: 2017-09-04 | Discharge: 2017-09-04 | Disposition: A | Discharge status: Home / Self Care | Payer: BC Managed Care – PPO | Attending: Internal Medicine | Admitting: Internal Medicine

## 2017-09-04 DIAGNOSIS — G459 Transient cerebral ischemic attack, unspecified: Secondary | ICD-10-CM

## 2017-09-04 DIAGNOSIS — R4701 Aphasia: Secondary | ICD-10-CM | POA: Diagnosis not present

## 2017-09-04 LAB — ECHOCARDIOGRAM COMPLETE
Height: 62 in
Weight: 4142.4 oz

## 2017-09-04 LAB — HEMOGLOBIN A1C
Hgb A1c MFr Bld: 5.6 % (ref 4.8–5.6)
Mean Plasma Glucose: 114.02 mg/dL

## 2017-09-04 LAB — PROTIME-INR
INR: 1.65
Prothrombin Time: 19.4 seconds — ABNORMAL HIGH (ref 11.4–15.2)

## 2017-09-04 LAB — LIPID PANEL
Cholesterol: 171 mg/dL (ref 0–200)
HDL: 38 mg/dL — ABNORMAL LOW (ref >40)
LDL Cholesterol: 101 mg/dL — ABNORMAL HIGH (ref 0–99)
Total CHOL/HDL Ratio: 4.5 RATIO
Triglycerides: 161 mg/dL — ABNORMAL HIGH (ref <150)
VLDL: 32 mg/dL (ref 0–40)

## 2017-09-04 MED ORDER — TRIAMTERENE-HCTZ 37.5-25 MG PO CAPS
1.0000 | ORAL_CAPSULE | Freq: Every day | ORAL | Status: DC
  Filled 2017-09-04: qty 1

## 2017-09-04 MED ORDER — ALLOPURINOL 300 MG PO TABS
300.0000 mg | ORAL_TABLET | Freq: Every day | ORAL | Status: DC
  Administered 2017-09-04: 02:00:00 300 mg via ORAL
  Filled 2017-09-04: qty 1

## 2017-09-04 MED ORDER — WARFARIN SODIUM 2.5 MG PO TABS: ORAL_TABLET | ORAL | 0 refills | Status: DC

## 2017-09-04 MED ORDER — WARFARIN SODIUM 5 MG PO TABS
5.0000 mg | ORAL_TABLET | Freq: Once | ORAL | Status: AC
  Administered 2017-09-04: 5 mg via ORAL
  Filled 2017-09-04: qty 1

## 2017-09-04 MED ORDER — WARFARIN - PHYSICIAN DOSING INPATIENT: Freq: Every day | Status: DC

## 2017-09-04 MED ORDER — TRIAMTERENE-HCTZ 37.5-25 MG PO TABS
1.0000 | ORAL_TABLET | Freq: Every day | ORAL | Status: DC
  Administered 2017-09-04: 1 via ORAL
  Filled 2017-09-04: qty 1

## 2017-09-04 MED ORDER — ENOXAPARIN SODIUM 120 MG/0.8ML ~~LOC~~ SOLN: 1.0000 mg/kg | Freq: Two times a day (BID) | SUBCUTANEOUS | 0 refills | Status: DC

## 2017-09-04 MED ORDER — ENOXAPARIN SODIUM 120 MG/0.8ML ~~LOC~~ SOLN
1.0000 mg/kg | Freq: Once | SUBCUTANEOUS | Status: AC
  Administered 2017-09-04: 17:00:00 115 mg via SUBCUTANEOUS
  Filled 2017-09-04 (×2): qty 0.8

## 2017-09-04 MED ORDER — ATORVASTATIN CALCIUM 40 MG PO TABS: 40.0000 mg | ORAL_TABLET | Freq: Every day | ORAL | 0 refills | Status: DC

## 2017-09-04 MED ORDER — WARFARIN SODIUM 5 MG PO TABS
5.0000 mg | ORAL_TABLET | Freq: Every day | ORAL | Status: DC
  Filled 2017-09-04: qty 1

## 2017-09-04 MED ORDER — WARFARIN SODIUM 7.5 MG PO TABS
7.5000 mg | ORAL_TABLET | Freq: Once | ORAL | Status: AC
  Administered 2017-09-04: 7.5 mg via ORAL
  Filled 2017-09-04: qty 1

## 2017-09-04 MED ORDER — ENOXAPARIN SODIUM 30 MG/0.3ML ~~LOC~~ SOLN: 1.0000 mg/kg | Freq: Two times a day (BID) | SUBCUTANEOUS | 0 refills | Status: DC

## 2017-09-04 MED ORDER — ATORVASTATIN CALCIUM 20 MG PO TABS
40.0000 mg | ORAL_TABLET | Freq: Every day | ORAL | Status: DC
  Administered 2017-09-04: 17:00:00 40 mg via ORAL
  Filled 2017-09-04: qty 2

## 2017-09-04 NOTE — Progress Notes (Signed)
Discussed discharge instructions and medications with patient. IV removed. All questions addressed. Patient transported home via car by her daughter.  Danielle Jartavious Mckimmy, RN 

## 2017-09-04 NOTE — Consult Note (Addendum)
Referring Physician: Elisabeth Pigeon    Chief Complaint: Aphasia and [facility]  HPI: Caitlyn Elliott is an 46 y.o. 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  Then had a black and white kaleidoscope sensation.  When she attempted to speak, it was not understandable.  Patient was brought to the ED at that time.  En route had left neck pain and left sided headache..  Episode lasted about 30 minutes and resolved spontaneously.   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   Over the past recent months has had Coumadin dosing decreased.   Initial NIHSS of 0.    Date last known well: Date: 09/03/2017 Time last known well: Time: 19:30 tPA Given: No: Resolution of symptoms.  On Coumadin with elevated INR.    Past Medical History:  Diagnosis Date  . Gout 03/2017  . History of blood clots   . Hypertension   . Stroke (HCC)   . Thyroid disease     History reviewed. No pertinent surgical history.  Family history: Father with migraines.  Mother with Barbaraann Share at an early age.    Social History:  reports that  has never smoked. she has never used smokeless tobacco. She reports that she does not drink alcohol or use drugs.  Allergies:  Allergies  Allergen Reactions  . Iron Rash    Slow FE iron OTC [facility] brand    Medications:  I have reviewed the patient's current medications. Prior to Admission:  Facility-Administered Medications Prior to Admission  Medication Dose Route Frequency Provider Last Rate Last Dose  . triamcinolone acetonide (KENALOG) 10 MG/ML injection 10 mg  10 mg Other Once Lenn Sink, DPM       Medications Prior to Admission  Medication Sig Dispense Refill Last Dose  . allopurinol (ZYLOPRIM) 300 MG tablet Take 300 mg daily by mouth.   09/02/2017 at N/A  . levothyroxine (SYNTHROID, LEVOTHROID) 112 MCG tablet  TAKE 1 TABLET BY MOUTH ONCE DAILY   09/03/2017 at N/A  . triamterene-hydrochlorothiazide (DYAZIDE) 37.5-25 MG per capsule Take by mouth.   09/03/2017 at N/A  . warfarin (COUMADIN) 2.5 MG tablet Take 2.5 mg daily by mouth.   N/A at N/A  . warfarin (COUMADIN) 5 MG tablet Take 5 mg daily by mouth.    09/02/2017 at N/A  . amoxicillin-clavulanate (AUGMENTIN) 875-125 MG tablet Take 1 tablet 2 (two) times daily by mouth.   Completed Course at N/A  . [EXPIRED] trimethoprim-polymyxin b (POLYTRIM) ophthalmic solution Place 1 drop every 3 (three) hours into the right eye. For 7 days - max 6 doses in 24 hours   Not Taking at N/A   Scheduled: . allopurinol  300 mg Oral Daily  . levothyroxine  112 mcg Oral QAC breakfast  . triamterene-hydrochlorothiazide  1 tablet Oral Daily  . warfarin  5 mg Oral q1800    ROS: History obtained from the patient  General ROS: negative for - chills, fatigue, fever, night sweats, weight gain or weight loss Psychological ROS: negative for - behavioral disorder, hallucinations, memory difficulties, mood swings or suicidal ideation Ophthalmic ROS: as noted in HPI ENT ROS: negative for - epistaxis, nasal discharge, oral lesions, sore throat, tinnitus or vertigo Allergy and Immunology ROS: negative for - hives or itchy/watery eyes Hematological and Lymphatic ROS: negative for -  bleeding problems, bruising or swollen lymph nodes Endocrine ROS: negative for - galactorrhea, hair pattern changes, polydipsia/polyuria or temperature intolerance Respiratory ROS: negative for - cough, hemoptysis, shortness of breath or wheezing Cardiovascular ROS: negative for - chest pain, dyspnea on exertion, edema or irregular heartbeat Gastrointestinal ROS: negative for - abdominal pain, diarrhea, hematemesis, nausea/vomiting or stool incontinence Genito-Urinary ROS: negative for - dysuria, hematuria, incontinence or urinary frequency/urgency Musculoskeletal ROS: negative for - joint swelling or  muscular weakness Neurological ROS: as noted in HPI Dermatological ROS: negative for rash and skin lesion changes  Physical Examination: Blood pressure 116/71, pulse 88, temperature 98.1 F (36.7 C), temperature source Oral, resp. rate 16, height 5\' 2"  (1.575 m), weight 117.4 kg (258 lb 14.4 oz), last menstrual period 08/18/2017, SpO2 99 %.  HEENT-  Normocephalic, no lesions, without obvious abnormality.  Normal external eye and conjunctiva.  Normal TM's bilaterally.  Normal auditory canals and external ears. Normal external nose, mucus membranes and septum.  Normal pharynx. Cardiovascular- S1, S2 normal, pulses palpable throughout   Lungs- chest clear, no wheezing, rales, normal symmetric air entry Abdomen- soft, non-tender; bowel sounds normal; no masses,  no organomegaly Extremities- mild LE edema Lymph-no adenopathy palpable Musculoskeletal-no joint tenderness, deformity or swelling Skin-warm and dry, no hyperpigmentation, vitiligo, or suspicious lesions  Neurological Examination   Mental Status: Alert, oriented, thought content appropriate.  Speech fluent without evidence of aphasia.  Able to follow 3 step commands without difficulty. Cranial Nerves: II: Discs flat bilaterally; Visual fields grossly normal, pupils equal, round, reactive to light and accommodation III,IV, VI: ptosis not present, extra-ocular motions intact bilaterally V,VII: smile symmetric, facial light touch sensation normal bilaterally VIII: hearing normal bilaterally IX,X: gag reflex present XI: bilateral shoulder shrug XII: midline tongue extension Motor: Right : Upper extremity   5/5    Left:     Upper extremity   5/5  Lower extremity   5/5     Lower extremity   5/5 Tone and bulk:normal tone throughout; no atrophy noted Sensory: Pinprick and light touch intact throughout, bilaterally Deep Tendon Reflexes: 2+ and symmetric throughout Plantars: Right: downgoing   Left: downgoing Cerebellar: normal  finger-to-nose, normal rapid alternating movements and normal heel-to-shin test Gait: normal gait and station    Laboratory Studies:  Basic Metabolic Panel: Recent Labs  Lab 09/03/17 03/16/48  NA 135  K 3.3*  CL 100*  CO2 24  GLUCOSE 125*  BUN 21*  CREATININE 1.09*  CALCIUM 9.2    Liver Function Tests: Recent Labs  Lab 09/03/17 Mar 16, 2048  AST 24  ALT 17  ALKPHOS 62  BILITOT 0.9  PROT 7.6  ALBUMIN 4.0   No results for input(s): LIPASE, AMYLASE in the last 168 hours. No results for input(s): AMMONIA in the last 168 hours.  CBC: Recent Labs  Lab 09/03/17 2048-03-16  WBC 7.2  NEUTROABS 4.8  HGB 14.5  HCT 43.1  MCV 89.5  PLT 234    Cardiac Enzymes: Recent Labs  Lab 09/03/17 03/16/48  TROPONINI <0.03    BNP: Invalid input(s): POCBNP  CBG: No results for input(s): GLUCAP in the last 168 hours.  Microbiology: No results found for this or any previous visit.  Coagulation Studies: Recent Labs    09/03/17 16-Mar-2048  LABPROT 20.2*  INR 1.74    Urinalysis: No results for input(s): COLORURINE, LABSPEC, PHURINE, GLUCOSEU, HGBUR, BILIRUBINUR, KETONESUR, PROTEINUR, UROBILINOGEN, NITRITE, LEUKOCYTESUR in the last 168 hours.  Invalid input(s): APPERANCEUR  Lipid Panel:    Component Value Date/Time  CHOL 171 09/04/2017 0704   TRIG 161 (H) 09/04/2017 0704   HDL 38 (L) 09/04/2017 0704   CHOLHDL 4.5 09/04/2017 0704   VLDL 32 09/04/2017 0704   LDLCALC 101 (H) 09/04/2017 0704    HgbA1C:  Lab Results  Component Value Date   HGBA1C 5.6 09/04/2017    Urine Drug Screen:  No results found for: LABOPIA, COCAINSCRNUR, LABBENZ, AMPHETMU, THCU, LABBARB  Alcohol Level:  Recent Labs  Lab 09/03/17 2049  [facility]    Imaging: Mr Brain Wo Contrast  Result Date: 09/04/2017 CLINICAL DATA:  Initial evaluation for acute aphasia with left-sided sensory deficit and weakness. EXAM: MRI HEAD WITHOUT CONTRAST MRA HEAD WITHOUT CONTRAST TECHNIQUE: Multiplanar, multiecho pulse sequences of  the brain and surrounding structures were obtained without intravenous contrast. Angiographic images of the head were obtained using MRA technique without contrast. COMPARISON:  Prior CT from 09/03/2017. FINDINGS: MRI HEAD FINDINGS Brain: Cerebral volume within normal limits for age. Few scattered subcentimeter T2/FLAIR hyperintense foci in noted within the periventricular deep white matter both cerebral hemispheres, nonspecific, but felt to be within normal limits for age. No abnormal foci of restricted diffusion to suggest acute or subacute ischemia. Gray-white matter differentiation maintained. Small remote focus of encephalomalacia and gliosis present at the postcentral gyrus of the right parietal lobe (series 11, image 21), consistent with a small chronic cortical infarct. Chronic hemosiderin staining present within this region. No other evidence for chronic infarction or hemorrhage. No mass lesion, midline shift or mass effect. No hydrocephalus. No extra-axial fluid collection. Major dural sinuses grossly patent. Pituitary gland suprasellar region normal. Midline structures intact and normal. Vascular: Major intracranial vascular flow voids maintained. Skull and upper cervical spine: Craniocervical junction normal. Upper cervical spine within normal limits. Calvarium intact. No scalp soft tissue abnormality. Bone marrow signal intensity somewhat diffusely decreased on T1 weighted imaging, most commonly related to anemia, smoking, or obesity. Sinuses/Orbits: Globes normal soft tissues within normal limits. Paranasal sinuses clear. Trace bilateral mastoid effusions, of doubtful significance. Inner ear structures normal. Other: None. MRA HEAD FINDINGS ANTERIOR CIRCULATION: Distal cervical segments of the internal carotid artery is patent with antegrade flow. Petrous, cavernous, and supraclinoid segments patent without flow-limiting stenosis. A1 segments widely patent. Anterior communicating artery normal.  Anterior cerebral arteries patent to their distal aspects. M1 segments patent without stenosis or occlusion. Normal MCA bifurcations. No proximal M2 occlusion. Distal MCA branches well perfused and symmetric. POSTERIOR CIRCULATION: Vertebral arteries patent in the vertebrobasilar junction without stenosis. Left vertebral artery dominant. Posterior inferior cerebral arteries patent proximally. Basilar artery widely patent to its distal aspect. Superior cerebral arteries patent bilaterally. Both of the posterior cerebral arteries primarily supplied via the basilar and are widely patent to their distal aspects. No aneurysm or vascular malformation. IMPRESSION: MRI HEAD IMPRESSION: 1. No acute intracranial process identified. 2. Small remote right parietal cortical infarct as above. 3. Otherwise normal brain MRI. MRA HEAD IMPRESSION: Normal intracranial MRA. Electronically Signed   By: Rise MuBenjamin  McClintock M.D.   On: 09/04/2017 06:31   [place] Carotid Bilateral (at [facility] And [facility] Only)  Result Date: 09/04/2017 CLINICAL DATA:  Aphasia. Visual disturbance. History of hypertension hyperlipidemia. EXAM: BILATERAL CAROTID DUPLEX ULTRASOUND TECHNIQUE: Wallace CullensGray scale imaging, color Doppler and duplex ultrasound were performed of bilateral carotid and vertebral arteries in the neck. COMPARISON:  None. FINDINGS: Criteria: Quantification of carotid stenosis is based on velocity parameters that correlate the residual internal carotid diameter with NASCET-based stenosis levels, using the diameter of the distal internal carotid  lumen as the denominator for stenosis measurement. The following velocity measurements were obtained: RIGHT ICA:  82/35 cm/sec CCA:  95/1 cm/sec SYSTOLIC ICA/CCA RATIO:  0.9 DIASTOLIC ICA/CCA RATIO:  1.7 ECA:  114 cm/sec LEFT ICA:  97/31 cm/sec CCA:  90/22 cm/sec SYSTOLIC ICA/CCA RATIO:  1.1 DIASTOLIC ICA/CCA RATIO:  1.4 ECA:  50 not cm/sec RIGHT CAROTID ARTERY: There is no grayscale evidence of significant wall  thickening or atherosclerotic plaque affecting the interrogated portions of the right carotid system. There are no elevated peak systolic velocities within the interrogated course the right internal carotid artery to suggest a hemodynamically significant stenosis RIGHT VERTEBRAL ARTERY:  Antegrade flow LEFT CAROTID ARTERY: There is no grayscale evidence of significant wall thickening or atherosclerotic plaque affecting the interrogated portions of the left carotid system. There are no elevated peak systolic velocities within the left internal carotid artery to suggest a hemodynamically significant stenosis. LEFT VERTEBRAL ARTERY:  Antegrade flow IMPRESSION: Normal carotid Doppler ultrasound. Electronically Signed   By: Simonne Come M.D.   On: 09/04/2017 12:07   Mr Maxine Glenn Head/brain ZO Cm  Result Date: 09/04/2017 CLINICAL DATA:  Initial evaluation for acute aphasia with left-sided sensory deficit and weakness. EXAM: MRI HEAD WITHOUT CONTRAST MRA HEAD WITHOUT CONTRAST TECHNIQUE: Multiplanar, multiecho pulse sequences of the brain and surrounding structures were obtained without intravenous contrast. Angiographic images of the head were obtained using MRA technique without contrast. COMPARISON:  Prior CT from 09/03/2017. FINDINGS: MRI HEAD FINDINGS Brain: Cerebral volume within normal limits for age. Few scattered subcentimeter T2/FLAIR hyperintense foci in noted within the periventricular deep white matter both cerebral hemispheres, nonspecific, but felt to be within normal limits for age. No abnormal foci of restricted diffusion to suggest acute or subacute ischemia. Gray-white matter differentiation maintained. Small remote focus of encephalomalacia and gliosis present at the postcentral gyrus of the right parietal lobe (series 11, image 21), consistent with a small chronic cortical infarct. Chronic hemosiderin staining present within this region. No other evidence for chronic infarction or hemorrhage. No mass  lesion, midline shift or mass effect. No hydrocephalus. No extra-axial fluid collection. Major dural sinuses grossly patent. Pituitary gland suprasellar region normal. Midline structures intact and normal. Vascular: Major intracranial vascular flow voids maintained. Skull and upper cervical spine: Craniocervical junction normal. Upper cervical spine within normal limits. Calvarium intact. No scalp soft tissue abnormality. Bone marrow signal intensity somewhat diffusely decreased on T1 weighted imaging, most commonly related to anemia, smoking, or obesity. Sinuses/Orbits: Globes normal soft tissues within normal limits. Paranasal sinuses clear. Trace bilateral mastoid effusions, of doubtful significance. Inner ear structures normal. Other: None. MRA HEAD FINDINGS ANTERIOR CIRCULATION: Distal cervical segments of the internal carotid artery is patent with antegrade flow. Petrous, cavernous, and supraclinoid segments patent without flow-limiting stenosis. A1 segments widely patent. Anterior communicating artery normal. Anterior cerebral arteries patent to their distal aspects. M1 segments patent without stenosis or occlusion. Normal MCA bifurcations. No proximal M2 occlusion. Distal MCA branches well perfused and symmetric. POSTERIOR CIRCULATION: Vertebral arteries patent in the vertebrobasilar junction without stenosis. Left vertebral artery dominant. Posterior inferior cerebral arteries patent proximally. Basilar artery widely patent to its distal aspect. Superior cerebral arteries patent bilaterally. Both of the posterior cerebral arteries primarily supplied via the basilar and are widely patent to their distal aspects. No aneurysm or vascular malformation. IMPRESSION: MRI HEAD IMPRESSION: 1. No acute intracranial process identified. 2. Small remote right parietal cortical infarct as above. 3. Otherwise normal brain MRI. MRA HEAD IMPRESSION: Normal intracranial MRA.  Electronically Signed   By: Rise MuBenjamin  McClintock  M.D.   On: 09/04/2017 06:31   Ct Head Code Stroke Wo Contrast  Result Date: 09/03/2017 CLINICAL DATA:  Code stroke. Sudden onset of blurred vision and slurred speech today. EXAM: CT HEAD WITHOUT CONTRAST TECHNIQUE: Contiguous axial images were obtained from the base of the skull through the vertex without intravenous contrast. COMPARISON:  MRI 11/25/2007.  CT 11/21/2007. FINDINGS: Brain: No acute finding today. No sign of acute infarction, mass lesion, hemorrhage, hydrocephalus or extra-axial collection. Mild focal atrophy at the right posterior frontal vertex. Remarkably little sequela to the old right posterior frontal hemorrhagic infarction that occurred in 2009. Vascular: No abnormal vascular finding. Skull: Normal Sinuses/Orbits: Clear/normal Other: None ASPECTS ([place]berta Stroke Program Early CT Score) - Ganglionic level infarction (caudate, lentiform nuclei, internal capsule, insula, M1-M3 cortex): 7 - Supraganglionic infarction (M4-M6 cortex): 3 Total score (0-10 with 10 being normal): 10 IMPRESSION: 1. No acute finding. Mild atrophy at the right posterior frontal vertex were there was a hemorrhagic infarction in 2009. 2. ASPECTS is 10 These results were called by telephone at the time of interpretation on 09/03/2017 at 8:55 pm to Dr. Gladstone PihAVID SCHAEVITZ , who verbally acknowledged these results. Electronically Signed   By: Paulina FusiMark  Shogry M.D.   On: 09/03/2017 20:58    Assessment: 46 y.o. 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  Symptoms now resolved.  MRI reviewed and shows no acute changes.  MRA unremarkable.  INR subtherapeutic at 1.7.  Carotid dopplers show no evidence of hemodynamically significant stenosis.  Echocardiogram shows no cardiac source of emboli with an EF of 55-65%.  A1c 5.6, LDL 101. Patient has had hypercoagulable work up in the past after her first stroke and at this point I do not believe she will benefit from having this repeated.   Des not report having had a TEE.  Although TIA very high on the differential can not rule out the possibility of complicated migraine particularly with the family history of migraine.  Would not expect focal symptoms to be right sided though.    Stroke Risk Factors - hypertension  Plan: 1. Prophylactic therapy-Continue Coumadin.  Would return to dosing that patient was taking prior to decrease.   2. Telemetry monitoring 3. Frequent neuro checks 4. Patient may benefit from a bubble study or TEE as an outpatient to rule out PFO 5. Patient may benefit from genotype testing since no etiology for what appears to be a hypercoagulable state has been identified.     Thana FarrLeslie Bari Leib, MD Neurology [phone] 09/04/2017, 12:57 PM

## 2017-09-04 NOTE — Progress Notes (Signed)
OT Cancellation Note  Patient Details Name: Caitlyn Elliott MRN: [id number] DOB: 04/26/1971   Cancelled Treatment:    Reason Eval/Treat Not Completed: Patient at procedure or test/ unavailable. Order received, chart reviewed. Pt out of room for testing. Will re-attempt OT evaluation at later date/time as pt is medically appropriate and as schedule permits.  Richrd PrimeJamie Stiller, MPH, MS, OTR/L ascom [phone] 09/04/17, 10:52 AM

## 2017-09-04 NOTE — Progress Notes (Signed)
PT Cancellation Note  Patient Details Name: Caitlyn Elliott MRN: [id number] DOB: 06/23/1971   Cancelled Treatment:    Reason Eval/Treat Not Completed: (Consult received and chart reviewed. Patient reports complete resolution of presenting symptoms; states she has been ambulating in room without difficulty/safety concern.  Denies need for PT evaluation/intervention at this time.  Family (mother, daughter) in room and voiced agreement.  Will complete PT order at this time; please re-consult should needs change.)   Edker Punt H. Manson PasseyBrown, PT, DPT, NCS 09/04/17, 11:16 AM [phone]

## 2017-09-04 NOTE — Progress Notes (Signed)
ANTICOAGULATION CONSULT NOTE - Initial Consult  Pharmacy Consult for warfarin dosing Indication: VTE prophylaxis  Allergies  Allergen Reactions  . Iron Rash    Slow FE iron OTC [facility] brand    Patient Measurements: Height: 5\' 2"  (157.5 cm) Weight: 258 lb 14.4 oz (117.4 kg) IBW/kg (Calculated) : 50.1 Heparin Dosing Weight: n/a  Vital Signs: Temp: 97.6 F (36.4 C) (11/13 2314) Temp Source: Oral (11/13 2314) BP: 121/72 (11/13 2314) Pulse Rate: 87 (11/13 2314)  Labs: Recent Labs    09/03/17 2049  HGB 14.5  HCT 43.1  PLT 234  APTT 38*  LABPROT 20.2*  INR 1.74  CREATININE 1.09*  TROPONINI <0.03    Estimated Creatinine Clearance: 77.6 mL/min (A) (by C-G formula based on SCr of 1.09 mg/dL (H)).   Medical History: Past Medical History:  Diagnosis Date  . Gout 03/2017  . History of blood clots   . Hypertension   . Stroke (HCC)   . Thyroid disease     Medications:  Home dose of warfarin 5 mg daily with 2.5 mg PRN.  Assessment: INR subtherapeutic on admission  Goal of Therapy:  INR 2-3    Plan:  Restart home dose of 5 mg daily with 1x dose tonight since patient has not had today's dose. Recheck INR 11/15 AM.  Chrishaun Sasso S 09/04/2017,12:09 AM

## 2017-09-04 NOTE — Progress Notes (Signed)
*  PRELIMINARY RESULTS* Echocardiogram 2D Echocardiogram has been performed.  Cristela BlueHege, Glinda Natzke 09/04/2017, 11:53 AM

## 2017-09-04 NOTE — Discharge Summary (Signed)
[facility] [place] [facility]   PATIENT NAME: Caitlyn Elliott    MR#:  [id number]  DATE OF BIRTH:  03/19/1971  DATE OF ADMISSION:  09/03/2017 ADMITTING PHYSICIAN: Oralia Manisavid Willis, MD  DATE OF DISCHARGE: 09/04/2017  PRIMARY CARE PHYSICIAN: Leim FabryAldridge, Barbara, MD    ADMISSION DIAGNOSIS:  Cerebrovascular accident (CVA), unspecified mechanism (HCC) [I63.9] Aphasia [R47.01]  DISCHARGE DIAGNOSIS:  Principal Problem:   Aphasia Active Problems:   HLD (hyperlipidemia)   HTN (hypertension)   History of anticoagulant therapy   Disease of thyroid gland   TIA  SECONDARY DIAGNOSIS:   Past Medical History:  Diagnosis Date  . Gout 03/2017  . History of blood clots   . Hypertension   . Stroke (HCC)   . Thyroid disease     HOSPITAL COURSE:   * Aphasia likely due to TIA -has resolved now, however patient was recommended by telemetry neurology to have full stroke workup.  Admitted per stroke workup     No acute stroke per MRI and No intracranial vascular abnormalities per MRA   Carotid doppler and echocardiogram are not suggestive of any abnormalities.   Seen by neurologist, suggest to have out pt neurologist folow up, Hypercoagulability work ups and TEE to check for PFO.   Pt is asymptomatic now, so d/c home.   She was taking Coumadin ( recent decrease dose) and have low INR, so suggest to go back to previous coumadin dose.   INR is low, so giving Lovenox inj for therapeutic anticoagulation for 5 days.   Advise to check INR with PMD in 2-3 days.   LDL is high, goal < 70- started on atorvastatin.  Active Problems:   HTN (hypertension) -resume home meds on d/c.   HLD (hyperlipidemia) -continue home meds   History of anticoagulant therapy -continue warfarin therapeutic dose   Disease of thyroid gland -home dose thyroid replacement  DISCHARGE CONDITIONS:   Stable.  CONSULTS OBTAINED:  Treatment Team:  Kym Groomriadhosp, Neuro1, MD  DRUG ALLERGIES:    Allergies  Allergen Reactions  . Iron Rash    Slow FE iron OTC [facility] brand    DISCHARGE MEDICATIONS:   Current Discharge Medication List    START taking these medications   Details  atorvastatin (LIPITOR) 40 MG tablet Take 1 tablet (40 mg total) daily at 6 PM by mouth. Qty: 30 tablet, Refills: 0    enoxaparin (LOVENOX) 30 MG/0.3ML injection Inject 1.2 mLs (120 mg total) 2 (two) times daily for 5 days into the skin. Qty: 10 Syringe, Refills: 0      CONTINUE these medications which have CHANGED   Details  !! warfarin (COUMADIN) 2.5 MG tablet Use twice a week- on Wednesday and saturdays. Along with 5 mg daily. Qty: 10 tablet, Refills: 0     !! - Potential duplicate medications found. Please discuss with provider.    CONTINUE these medications which have NOT CHANGED   Details  allopurinol (ZYLOPRIM) 300 MG tablet Take 300 mg daily by mouth.    levothyroxine (SYNTHROID, LEVOTHROID) 112 MCG tablet TAKE 1 TABLET BY MOUTH ONCE DAILY    triamterene-hydrochlorothiazide (DYAZIDE) 37.5-25 MG per capsule Take by mouth.    !! warfarin (COUMADIN) 5 MG tablet Take 5 mg daily by mouth.      !! - Potential duplicate medications found. Please discuss with provider.    STOP taking these medications     amoxicillin-clavulanate (AUGMENTIN) 875-125 MG tablet      trimethoprim-polymyxin b (POLYTRIM) ophthalmic solution  DISCHARGE INSTRUCTIONS:    Follow with PMD to check INR in 2 days and with neurologist in 1-2 weeks.  If you experience worsening of your admission symptoms, develop shortness of breath, life threatening emergency, suicidal or homicidal thoughts you must seek medical attention immediately by calling 911 or calling your MD immediately  if symptoms less severe.  You Must read complete instructions/literature along with all the possible adverse reactions/side effects for all the Medicines you take and that have been prescribed to you. Take any new Medicines  after you have completely understood and accept all the possible adverse reactions/side effects.   Please note  You were cared for by a hospitalist during your hospital stay. If you have any questions about your discharge medications or the care you received while you were in the hospital after you are discharged, you can call the unit and asked to speak with the hospitalist on call if the hospitalist that took care of you is not available. Once you are discharged, your primary care physician will handle any further medical issues. Please note that NO REFILLS for any discharge medications will be authorized once you are discharged, as it is imperative that you return to your primary care physician (or establish a relationship with a primary care physician if you do not have one) for your aftercare needs so that they can reassess your need for medications and monitor your lab values.    Today   CHIEF COMPLAINT:   Chief Complaint  Patient presents with  . Weakness    HISTORY OF PRESENT ILLNESS:  Caitlyn Cowperaula Siebert  is a 46 y.o. female with a known history of stroke and PE long time ago on coumadin now, presents with an episode of a aphasia and left-sided sensory deficit and weakness.  Patient has a history of prior stroke after which she had some residual left-sided deficits, though she stated that they recovered nearly completely over time leaving only some mild distal left arm sensory deficit.  Tonight she had two episodes of word finding difficulty.  She then had some left-sided weakness and increased sensory deficit on that side.  This was all followed by left-sided neck pain and headache.  Here in the ED her symptoms have mostly resolved except for some mild persistent increase in her left upper extremity sensory deficit.  Hospitalist were called for admission and evaluation for possible stroke   VITAL SIGNS:  Blood pressure 116/71, pulse 88, temperature 98.1 F (36.7 C), temperature source Oral,  resp. rate 16, height 5\' 2"  (1.575 m), weight 117.4 kg (258 lb 14.4 oz), last menstrual period 08/18/2017, SpO2 99 %.  I/O:    Intake/Output Summary (Last 24 hours) at 09/04/2017 1633 Last data filed at 09/04/2017 0413 Gross per 24 hour  Intake 476.67 ml  Output -  Net 476.67 ml    PHYSICAL EXAMINATION:  GENERAL:  46 y.o.-year-old patient lying in the bed with no acute distress.  EYES: Pupils equal, round, reactive to light and accommodation. No scleral icterus. Extraocular muscles intact.  HEENT: Head atraumatic, normocephalic. Oropharynx and nasopharynx clear.  NECK:  Supple, no jugular venous distention. No thyroid enlargement, no tenderness.  LUNGS: Normal breath sounds bilaterally, no wheezing, rales,rhonchi or crepitation. No use of accessory muscles of respiration.  CARDIOVASCULAR: S1, S2 normal. No murmurs, rubs, or gallops.  ABDOMEN: Soft, non-tender, non-distended. Bowel sounds present. No organomegaly or mass.  EXTREMITIES: No pedal edema, cyanosis, or clubbing.  NEUROLOGIC: Cranial nerves II through XII are intact. Muscle  strength 5/5 in all extremities. Sensation intact. Gait not checked.  PSYCHIATRIC: The patient is alert and oriented x 3.  SKIN: No obvious rash, lesion, or ulcer.   DATA REVIEW:   CBC Recent Labs  Lab 09/03/17 2049  WBC 7.2  HGB 14.5  HCT 43.1  PLT 234    Chemistries  Recent Labs  Lab 09/03/17 2049  NA 135  K 3.3*  CL 100*  CO2 24  GLUCOSE 125*  BUN 21*  CREATININE 1.09*  CALCIUM 9.2  AST 24  ALT 17  ALKPHOS 62  BILITOT 0.9    Cardiac Enzymes Recent Labs  Lab 09/03/17 2049  TROPONINI <0.03    Microbiology Results  No results found for this or any previous visit.  RADIOLOGY:  Mr Brain Wo Contrast  Result Date: 09/04/2017 CLINICAL DATA:  Initial evaluation for acute aphasia with left-sided sensory deficit and weakness. EXAM: MRI HEAD WITHOUT CONTRAST MRA HEAD WITHOUT CONTRAST TECHNIQUE: Multiplanar, multiecho pulse  sequences of the brain and surrounding structures were obtained without intravenous contrast. Angiographic images of the head were obtained using MRA technique without contrast. COMPARISON:  Prior CT from 09/03/2017. FINDINGS: MRI HEAD FINDINGS Brain: Cerebral volume within normal limits for age. Few scattered subcentimeter T2/FLAIR hyperintense foci in noted within the periventricular deep white matter both cerebral hemispheres, nonspecific, but felt to be within normal limits for age. No abnormal foci of restricted diffusion to suggest acute or subacute ischemia. Gray-white matter differentiation maintained. Small remote focus of encephalomalacia and gliosis present at the postcentral gyrus of the right parietal lobe (series 11, image 21), consistent with a small chronic cortical infarct. Chronic hemosiderin staining present within this region. No other evidence for chronic infarction or hemorrhage. No mass lesion, midline shift or mass effect. No hydrocephalus. No extra-axial fluid collection. Major dural sinuses grossly patent. Pituitary gland suprasellar region normal. Midline structures intact and normal. Vascular: Major intracranial vascular flow voids maintained. Skull and upper cervical spine: Craniocervical junction normal. Upper cervical spine within normal limits. Calvarium intact. No scalp soft tissue abnormality. Bone marrow signal intensity somewhat diffusely decreased on T1 weighted imaging, most commonly related to anemia, smoking, or obesity. Sinuses/Orbits: Globes normal soft tissues within normal limits. Paranasal sinuses clear. Trace bilateral mastoid effusions, of doubtful significance. Inner ear structures normal. Other: None. MRA HEAD FINDINGS ANTERIOR CIRCULATION: Distal cervical segments of the internal carotid artery is patent with antegrade flow. Petrous, cavernous, and supraclinoid segments patent without flow-limiting stenosis. A1 segments widely patent. Anterior communicating artery  normal. Anterior cerebral arteries patent to their distal aspects. M1 segments patent without stenosis or occlusion. Normal MCA bifurcations. No proximal M2 occlusion. Distal MCA branches well perfused and symmetric. POSTERIOR CIRCULATION: Vertebral arteries patent in the vertebrobasilar junction without stenosis. Left vertebral artery dominant. Posterior inferior cerebral arteries patent proximally. Basilar artery widely patent to its distal aspect. Superior cerebral arteries patent bilaterally. Both of the posterior cerebral arteries primarily supplied via the basilar and are widely patent to their distal aspects. No aneurysm or vascular malformation. IMPRESSION: MRI HEAD IMPRESSION: 1. No acute intracranial process identified. 2. Small remote right parietal cortical infarct as above. 3. Otherwise normal brain MRI. MRA HEAD IMPRESSION: Normal intracranial MRA. Electronically Signed   By: Rise Mu M.D.   On: 09/04/2017 06:31   US Carotid Bilateral (at [facility] And [facility] Only)  Result Date: 09/04/2017 CLINICAL DATA:  Aphasia. Visual disturbance. History of hypertension hyperlipidemia. EXAM: BILATERAL CAROTID DUPLEX ULTRASOUND TECHNIQUE: Wallace Cullens scale imaging, color Doppler and duplex  ultrasound were performed of bilateral carotid and vertebral arteries in the neck. COMPARISON:  None. FINDINGS: Criteria: Quantification of carotid stenosis is based on velocity parameters that correlate the residual internal carotid diameter with NASCET-based stenosis levels, using the diameter of the distal internal carotid lumen as the denominator for stenosis measurement. The following velocity measurements were obtained: RIGHT ICA:  82/35 cm/sec CCA:  95/1 cm/sec SYSTOLIC ICA/CCA RATIO:  0.9 DIASTOLIC ICA/CCA RATIO:  1.7 ECA:  114 cm/sec LEFT ICA:  97/31 cm/sec CCA:  90/22 cm/sec SYSTOLIC ICA/CCA RATIO:  1.1 DIASTOLIC ICA/CCA RATIO:  1.4 ECA:  50 not cm/sec RIGHT CAROTID ARTERY: There is no grayscale evidence of significant  wall thickening or atherosclerotic plaque affecting the interrogated portions of the right carotid system. There are no elevated peak systolic velocities within the interrogated course the right internal carotid artery to suggest a hemodynamically significant stenosis RIGHT VERTEBRAL ARTERY:  Antegrade flow LEFT CAROTID ARTERY: There is no grayscale evidence of significant wall thickening or atherosclerotic plaque affecting the interrogated portions of the left carotid system. There are no elevated peak systolic velocities within the left internal carotid artery to suggest a hemodynamically significant stenosis. LEFT VERTEBRAL ARTERY:  Antegrade flow IMPRESSION: Normal carotid Doppler ultrasound. Electronically Signed   By: Simonne Come M.D.   On: 09/04/2017 12:07   Mr Maxine Glenn Head/brain JW Cm  Result Date: 09/04/2017 CLINICAL DATA:  Initial evaluation for acute aphasia with left-sided sensory deficit and weakness. EXAM: MRI HEAD WITHOUT CONTRAST MRA HEAD WITHOUT CONTRAST TECHNIQUE: Multiplanar, multiecho pulse sequences of the brain and surrounding structures were obtained without intravenous contrast. Angiographic images of the head were obtained using MRA technique without contrast. COMPARISON:  Prior CT from 09/03/2017. FINDINGS: MRI HEAD FINDINGS Brain: Cerebral volume within normal limits for age. Few scattered subcentimeter T2/FLAIR hyperintense foci in noted within the periventricular deep white matter both cerebral hemispheres, nonspecific, but felt to be within normal limits for age. No abnormal foci of restricted diffusion to suggest acute or subacute ischemia. Gray-white matter differentiation maintained. Small remote focus of encephalomalacia and gliosis present at the postcentral gyrus of the right parietal lobe (series 11, image 21), consistent with a small chronic cortical infarct. Chronic hemosiderin staining present within this region. No other evidence for chronic infarction or hemorrhage. No mass  lesion, midline shift or mass effect. No hydrocephalus. No extra-axial fluid collection. Major dural sinuses grossly patent. Pituitary gland suprasellar region normal. Midline structures intact and normal. Vascular: Major intracranial vascular flow voids maintained. Skull and upper cervical spine: Craniocervical junction normal. Upper cervical spine within normal limits. Calvarium intact. No scalp soft tissue abnormality. Bone marrow signal intensity somewhat diffusely decreased on T1 weighted imaging, most commonly related to anemia, smoking, or obesity. Sinuses/Orbits: Globes normal soft tissues within normal limits. Paranasal sinuses clear. Trace bilateral mastoid effusions, of doubtful significance. Inner ear structures normal. Other: None. MRA HEAD FINDINGS ANTERIOR CIRCULATION: Distal cervical segments of the internal carotid artery is patent with antegrade flow. Petrous, cavernous, and supraclinoid segments patent without flow-limiting stenosis. A1 segments widely patent. Anterior communicating artery normal. Anterior cerebral arteries patent to their distal aspects. M1 segments patent without stenosis or occlusion. Normal MCA bifurcations. No proximal M2 occlusion. Distal MCA branches well perfused and symmetric. POSTERIOR CIRCULATION: Vertebral arteries patent in the vertebrobasilar junction without stenosis. Left vertebral artery dominant. Posterior inferior cerebral arteries patent proximally. Basilar artery widely patent to its distal aspect. Superior cerebral arteries patent bilaterally. Both of the posterior cerebral arteries primarily supplied via  the basilar and are widely patent to their distal aspects. No aneurysm or vascular malformation. IMPRESSION: MRI HEAD IMPRESSION: 1. No acute intracranial process identified. 2. Small remote right parietal cortical infarct as above. 3. Otherwise normal brain MRI. MRA HEAD IMPRESSION: Normal intracranial MRA. Electronically Signed   By: Rise Mu  M.D.   On: 09/04/2017 06:31   Ct Head Code Stroke Wo Contrast  Result Date: 09/03/2017 CLINICAL DATA:  Code stroke. Sudden onset of blurred vision and slurred speech today. EXAM: CT HEAD WITHOUT CONTRAST TECHNIQUE: Contiguous axial images were obtained from the base of the skull through the vertex without intravenous contrast. COMPARISON:  MRI 11/25/2007.  CT 11/21/2007. FINDINGS: Brain: No acute finding today. No sign of acute infarction, mass lesion, hemorrhage, hydrocephalus or extra-axial collection. Mild focal atrophy at the right posterior frontal vertex. Remarkably little sequela to the old right posterior frontal hemorrhagic infarction that occurred in 2009. Vascular: No abnormal vascular finding. Skull: Normal Sinuses/Orbits: Clear/normal Other: None ASPECTS (Alberta Stroke Program Early CT Score) - Ganglionic level infarction (caudate, lentiform nuclei, internal capsule, insula, M1-M3 cortex): 7 - Supraganglionic infarction (M4-M6 cortex): 3 Total score (0-10 with 10 being normal): 10 IMPRESSION: 1. No acute finding. Mild atrophy at the right posterior frontal vertex were there was a hemorrhagic infarction in 2009. 2. ASPECTS is 10 These results were called by telephone at the time of interpretation on 09/03/2017 at 8:55 pm to Dr. Gladstone Pih , who verbally acknowledged these results. Electronically Signed   By: Paulina Fusi M.D.   On: 09/03/2017 20:58    EKG:   Orders placed or performed during the hospital encounter of 09/03/17  . EKG 12-Lead  . EKG 12-Lead      Management plans discussed with the patient, family and they are in agreement.  CODE STATUS:     Code Status Orders  (From admission, onward)        Start     Ordered   09/03/17 2313  Full code  Continuous     09/03/17 2313    Code Status History    Date Active Date Inactive Code Status Order ID Comments User Context   This patient has a current code status but no historical code status.      TOTAL TIME  TAKING CARE OF THIS PATIENT: 35 minutes.    Altamese Dilling M.D on 09/04/2017 at 4:33 PM  Between 7am to 6pm - Pager - [phone]  After 6pm go to [email or web address] - password [email or web address]  [facility]  Office  [phone]  CC: Primary care physician; Leim Fabry, MD   Note: This dictation was prepared with Dragon dictation along with smaller phrase technology. Any transcriptional errors that result from this process are unintentional.

## 2017-09-04 NOTE — Progress Notes (Signed)
SLP Cancellation Note  Patient Details Name: Caitlyn Elliott MRN: [id number] DOB: November 27, 1970   Cancelled treatment:       Reason Eval/Treat Not Completed: SLP screened, no needs identified, will sign off(chart reviewed; consulted NSG then met w/ pt/family). Pt denied any difficulty swallowing and is currently on a regular diet; tolerates swallowing pills w/ water per NSG. Pt conversed at conversational level w/out deficits noted; pt and family denied any speech-language deficits today - resolved since episode at admission.  No further skilled ST services indicated as pt appears at her baseline. Pt agreed. NSG to reconsult if any change in status.    Orinda Kenner, [place], CCC-SLP 09/04/2017, 11:26 AM

## 2017-09-04 NOTE — Progress Notes (Signed)
OT Screen Note  Patient Details Name: Caitlyn Elliott MRN: [id number] DOB: 02/08/1971   Cancelled Treatment:    Reason Eval/Treat Not Completed: OT screened, no needs identified, will sign off. Order received, chart reviewed. Pt back to baseline independence, denies weakness/numbness/tingling/vision changes/speech changes/dizziness. No skilled OT needs. Will sign off. Please re-consult if additional needs arise.    Richrd PrimeJamie Stiller, MPH, MS, OTR/L ascom [phone] 09/04/17, 12:28 PM

## 2017-09-04 NOTE — Progress Notes (Signed)
[facility]         [place]   09/04/2017  Patient: Caitlyn Elliott   Date of Birth:  02/16/1971  Date of admission:  09/03/2017  Date of Discharge  09/04/2017    To Whom it May Concern:   Caitlyn Cowperaula Doner  may return to work on 09/08/17.  PHYSICAL ACTIVITY:  Full  If you have any questions or concerns, please don't hesitate to call.  Sincerely,   Altamese DillingVACHHANI, Danaye Sobh M.D Pager [phone] Office : [phone]   .

## 2017-09-05 LAB — HIV ANTIBODY (ROUTINE TESTING W REFLEX): HIV Screen 4th Generation wRfx: NONREACTIVE

## 2017-10-11 ENCOUNTER — Encounter: Payer: Self-pay | Admitting: Oncology

## 2017-10-11 ENCOUNTER — Other Ambulatory Visit: Payer: Self-pay

## 2017-10-11 ENCOUNTER — Inpatient Hospital Stay: Payer: BC Managed Care – PPO

## 2017-10-11 ENCOUNTER — Inpatient Hospital Stay: Payer: BC Managed Care – PPO | Attending: Oncology | Admitting: Oncology

## 2017-10-11 VITALS — BP 126/79 | HR 87 | Temp 99.5°F | Resp 16 | Ht 62.0 in | Wt 262.0 lb

## 2017-10-11 DIAGNOSIS — Z86718 Personal history of other venous thrombosis and embolism: Secondary | ICD-10-CM | POA: Insufficient documentation

## 2017-10-11 DIAGNOSIS — R4701 Aphasia: Secondary | ICD-10-CM | POA: Diagnosis not present

## 2017-10-11 DIAGNOSIS — I2699 Other pulmonary embolism without acute cor pulmonale: Secondary | ICD-10-CM | POA: Insufficient documentation

## 2017-10-11 DIAGNOSIS — I776 Arteritis, unspecified: Secondary | ICD-10-CM | POA: Diagnosis not present

## 2017-10-11 DIAGNOSIS — E079 Disorder of thyroid, unspecified: Secondary | ICD-10-CM | POA: Insufficient documentation

## 2017-10-11 DIAGNOSIS — G459 Transient cerebral ischemic attack, unspecified: Secondary | ICD-10-CM

## 2017-10-11 DIAGNOSIS — Z8673 Personal history of transient ischemic attack (TIA), and cerebral infarction without residual deficits: Secondary | ICD-10-CM | POA: Diagnosis not present

## 2017-10-11 DIAGNOSIS — I10 Essential (primary) hypertension: Secondary | ICD-10-CM | POA: Diagnosis not present

## 2017-10-11 DIAGNOSIS — M109 Gout, unspecified: Secondary | ICD-10-CM | POA: Insufficient documentation

## 2017-10-11 DIAGNOSIS — Z79899 Other long term (current) drug therapy: Secondary | ICD-10-CM

## 2017-10-11 DIAGNOSIS — E785 Hyperlipidemia, unspecified: Secondary | ICD-10-CM | POA: Insufficient documentation

## 2017-10-11 DIAGNOSIS — Z86711 Personal history of pulmonary embolism: Secondary | ICD-10-CM | POA: Insufficient documentation

## 2017-10-11 DIAGNOSIS — Z7901 Long term (current) use of anticoagulants: Secondary | ICD-10-CM | POA: Insufficient documentation

## 2017-10-11 LAB — C-REACTIVE PROTEIN: CRP: 0.8 mg/dL (ref <1.0)

## 2017-10-12 LAB — HEXAGONAL PHASE PHOSPHOLIPID: Hex Phosph Neut Test: 7 s (ref 0–11)

## 2017-10-12 LAB — HEX PHASE PHOSPHOLIPID REFLEX

## 2017-10-12 LAB — ANA COMPREHENSIVE PANEL
Anti JO-1: <0.2 AI (ref 0.0–0.9)
Centromere Ab Screen: <0.2 AI (ref 0.0–0.9)
Chromatin Ab SerPl-aCnc: <0.2 AI (ref 0.0–0.9)
ENA SM Ab Ser-aCnc: <0.2 AI (ref 0.0–0.9)
Ribonucleic Protein: <0.2 AI (ref 0.0–0.9)
SSA (Ro) (ENA) Antibody, IgG: <0.2 AI (ref 0.0–0.9)
SSB (La) (ENA) Antibody, IgG: <0.2 AI (ref 0.0–0.9)
Scleroderma (Scl-70) (ENA) Antibody, IgG: 2.8 AI — ABNORMAL HIGH (ref 0.0–0.9)
ds DNA Ab: 1 IU/mL (ref 0–9)

## 2017-10-14 ENCOUNTER — Other Ambulatory Visit: Payer: Self-pay | Admitting: *Deleted

## 2017-10-14 ENCOUNTER — Encounter: Payer: Self-pay | Admitting: Oncology

## 2017-10-14 LAB — BETA-2-GLYCOPROTEIN I ABS, IGG/M/A
Beta-2 Glyco I IgG: <9 GPI IgG units (ref 0–20)
Beta-2-Glycoprotein I IgA: <9 GPI IgA units (ref 0–25)
Beta-2-Glycoprotein I IgM: <9 GPI IgM units (ref 0–32)

## 2017-10-14 NOTE — Progress Notes (Signed)
Hematology/Oncology Consult note [facility] Telephone:([phone] [phone]  Patient Care Team: Leim Fabry, MD as PCP - General (Family Medicine)   Name of the patient: Caitlyn Elliott  [id number]  [birth year]    Reason for referral-hypercoagulability workup   Referring physician-Dr. Dayna Barker  Date of visit: 10/14/17   History of presenting illness-patient is a 46 year old female who had PE back in 2004.  This was an unprovoked PE and there was no proceeding air travel, surgery or episodes of immobilization.  She was not on birth control pills at that time.  She was on Coumadin for about a year and was later stopped.  She also saw Dr. Johnna Acosta from [facility] and had a factor V Leiden and prothrombin gene mutation testing back then which was negative.  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  She follows up with Dr. Dayna Barker for her INR management.  Reports that she was getting her INR checked about once a month and it was therapeutic in the past.  Recently there has been some change in the INR monitoring strips per patient leading to dose adjustments and her INR has been fluctuating.  She was recently admitted to the hospital on 09/04/2017 with symptoms of a aphasia which was thought to be secondary to TIA.  She underwent a CT head as well as MRI and MRA of the brain which did not show any acute infarct.  Of note her INR was subtherapeutic on admission.  Patient is yet to see neurology as an outpatient and continues to be on Coumadin.  She has been referred to [place] for hypercoagulability workup.  Prior to 2004 patient has not had any DVT or PE.  She is also not had any thrombotic events other than her stroke in 2009 while on Coumadin.  She denies any pregnancy losses or family history of blood clots.  She is wondering if she should switch to an alternative newer  anticoagulant given her recent TIA  ECOG PS- 0  Pain scale- 0   Review of systems- Review of Systems  Constitutional: Negative for chills, fever, malaise/fatigue and weight loss.  HENT: Negative for congestion, ear discharge and nosebleeds.   Eyes: Negative for blurred vision.  Respiratory: Negative for cough, hemoptysis, sputum production, shortness of breath and wheezing.   Cardiovascular: Negative for chest pain, palpitations, orthopnea and claudication.  Gastrointestinal: Negative for abdominal pain, blood in stool, constipation, diarrhea, heartburn, melena, nausea and vomiting.  Genitourinary: Negative for dysuria, flank pain, frequency, hematuria and urgency.  Musculoskeletal: Negative for back pain, joint pain and myalgias.  Skin: Negative for rash.  Neurological: Negative for dizziness, tingling, focal weakness, seizures, weakness and headaches.  Endo/Heme/Allergies: Does not bruise/bleed easily.  Psychiatric/Behavioral: Negative for depression and suicidal ideas. The patient does not have insomnia.     Allergies  Allergen Reactions  . Iron Rash    Slow FE iron OTC [facility] brand    Patient Active Problem List   Diagnosis Date Noted  . Aphasia 09/03/2017  . HLD (hyperlipidemia) 07/02/2014  . HTN (hypertension) 07/02/2014  . PE (pulmonary embolism) 07/02/2014  . Cerebral vascular accident (HCC) 07/02/2014  . Disease of thyroid gland 07/02/2014  . Lower urinary tract infection 09/15/2013  . History of anticoagulant therapy 06/28/2011     Past Medical History:  Diagnosis Date  . Anemia   . Gout 03/2017  . History of blood  clots   . Hypertension   . Stroke (HCC)   . Thyroid disease      History reviewed. No pertinent surgical history.  Social History   Socioeconomic History  . Marital status: Married    Spouse name: Not on file  . Number of children: 2  . Years of education: Not on file  . Highest education level: 12th grade  Social Needs  .  Financial resource strain: Not hard at all  . Food insecurity - worry: Never true  . Food insecurity - inability: Never true  . Transportation needs - medical: No  . Transportation needs - non-medical: No  Occupational History  . Not on file  Tobacco Use  . Smoking status: Never Smoker  . Smokeless tobacco: Never Used  Substance and Sexual Activity  . Alcohol use: No    Frequency: Never  . Drug use: No  . Sexual activity: Yes    Birth control/protection: Surgical  Other Topics Concern  . Not on file  Social History Narrative   Lives with husband at home     History reviewed. No pertinent family history.   Current Outpatient Medications:  .  allopurinol (ZYLOPRIM) 300 MG tablet, Take 300 mg daily by mouth., Disp: , Rfl:  .  levothyroxine (SYNTHROID, LEVOTHROID) 112 MCG tablet, TAKE 1 TABLET BY MOUTH ONCE DAILY, Disp: , Rfl:  .  triamterene-hydrochlorothiazide (DYAZIDE) 37.5-25 MG per capsule, Take by mouth., Disp: , Rfl:  .  warfarin (COUMADIN) 5 MG tablet, Take 5 mg by mouth daily. Patient takes varied doses of warfarin based on her INR levels, Disp: , Rfl:  .  enoxaparin (LOVENOX) 120 MG/0.8ML injection, Inject 0.78 mLs (115 mg total) every 12 (twelve) hours for 5 days into the skin. (Patient not taking: Reported on 10/11/2017), Disp: 10 Syringe, Rfl: 0   Physical exam:  Vitals:   10/11/17 1453  BP: 126/79  Pulse: 87  Resp: 16  Temp: 99.5 F (37.5 C)  TempSrc: Tympanic  Weight: 262 lb (118.8 kg)  Height: 5\' 2"  (1.575 m)   Physical Exam  Constitutional: She is oriented to person, place, and time and well-developed, well-nourished, and in no distress.  HENT:  Head: Normocephalic and atraumatic.  Eyes: EOM are normal. Pupils are equal, round, and reactive to light.  Neck: Normal range of motion.  Cardiovascular: Normal rate, regular rhythm and normal heart sounds.  Pulmonary/Chest: Effort normal and breath sounds normal.  Abdominal: Soft. Bowel sounds are normal.    Neurological: She is alert and oriented to person, place, and time.  Skin: Skin is warm and dry.       CMP Latest Ref Rng & Units 09/03/2017  Glucose 65 - 99 mg/dL 409(W125(H)  BUN 6 - 20 mg/dL 11(B21(H)  Creatinine 1.470.44 - 1.00 mg/dL 8.29(F1.09(H)  Sodium 621135 - 308145 mmol/L 135  Potassium 3.5 - 5.1 mmol/L 3.3(L)  Chloride 101 - 111 mmol/L 100(L)  CO2 22 - 32 mmol/L 24  Calcium 8.9 - 10.3 mg/dL 9.2  Total Protein 6.5 - 8.1 g/dL 7.6  Total Bilirubin 0.3 - 1.2 mg/dL 0.9  Alkaline Phos 38 - 126 U/L 62  AST 15 - 41 U/L 24  ALT 14 - 54 U/L 17   CBC Latest Ref Rng & Units 09/03/2017  WBC 3.6 - 11.0 K/uL 7.2  Hemoglobin 12.0 - 16.0 g/dL 65.714.5  Hematocrit 84.635.0 - 47.0 % 43.1  Platelets 150 - 440 K/uL 234     Assessment and plan- Patient is a  46 y.o. female referred to hematology for hypercoagulable workup  Patient has had a PE in 2004 and a stroke in 2009 which is concerning for hypercoagulable predisposition.  Strokes are arterial thrombotic events and typically genetic abnormalities and not responsible for these.  Patient is already had prothrombin gene and factor V Leiden mutation checked in the past which was negative.  Since patient is on Coumadin we are unable to check for protein C protein S Antithrombin III deficiency.  Given that she recently had a TIA,  I would not recommend holding her Coumadin for 2 weeks to check these blood tests as she would be at a risk for another thromboembolic event and it would not change management in her case.  Also typically protein C protein S and Antithrombin III deficiency are not associated with strokes.  I will check antiphospholipid antibody panel today including hex phase testing, anticardiolipin antibody as well as beta-2 glycoprotein.    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  I will repeat a comprehensive ANA panel today since vasculitis can be associated with arterial events.  Patient sees Dr. Ruthine DoseBehlal Bock from  rheumatology for her arthritis and if her vasculitis workup was positive I will discuss this with Dr. Renard MatterBock at that time  With regards to what anticoagulation to use-I will defer that to neurology since anticoagulation is for her stroke.  Also her INR was subtherapeutic in November 2018 so I would not currently recommend changing Coumadin to newer anticoagulants unless recommended by neurology.  I will see her back in 2 weeks time to discuss results of her blood work.  To Coumadin and INR will be continued to be managed by Dr. Dayna BarkerAldridge.  I will try to obtain Dr. Charmian MuffStefan Moll's note from 2005   Thank you for this kind referral and the opportunity to participate in the care of this patient   Visit Diagnosis 1. TIA (transient ischemic attack)     Dr. Owens SharkArchana Rao, MD, MPH [facility] Pager- [phone] 10/14/2017 9:15 AM

## 2017-10-24 ENCOUNTER — Encounter: Payer: Self-pay | Admitting: Oncology

## 2017-10-24 ENCOUNTER — Inpatient Hospital Stay: Payer: BC Managed Care – PPO | Attending: Oncology | Admitting: Oncology

## 2017-10-24 VITALS — BP 103/72 | HR 86 | Temp 97.6°F | Resp 18 | Ht 62.0 in | Wt 257.7 lb

## 2017-10-24 DIAGNOSIS — Z86711 Personal history of pulmonary embolism: Secondary | ICD-10-CM | POA: Diagnosis not present

## 2017-10-24 DIAGNOSIS — E079 Disorder of thyroid, unspecified: Secondary | ICD-10-CM | POA: Insufficient documentation

## 2017-10-24 DIAGNOSIS — M109 Gout, unspecified: Secondary | ICD-10-CM | POA: Diagnosis not present

## 2017-10-24 DIAGNOSIS — Z8673 Personal history of transient ischemic attack (TIA), and cerebral infarction without residual deficits: Secondary | ICD-10-CM | POA: Diagnosis not present

## 2017-10-24 DIAGNOSIS — I10 Essential (primary) hypertension: Secondary | ICD-10-CM | POA: Insufficient documentation

## 2017-10-24 DIAGNOSIS — Z7901 Long term (current) use of anticoagulants: Secondary | ICD-10-CM | POA: Insufficient documentation

## 2017-10-24 DIAGNOSIS — Z86718 Personal history of other venous thrombosis and embolism: Secondary | ICD-10-CM | POA: Insufficient documentation

## 2017-10-24 DIAGNOSIS — M1990 Unspecified osteoarthritis, unspecified site: Secondary | ICD-10-CM | POA: Diagnosis not present

## 2017-10-24 DIAGNOSIS — I2782 Chronic pulmonary embolism: Secondary | ICD-10-CM

## 2017-10-24 NOTE — Progress Notes (Signed)
Hematology/Oncology Consult note [facility]  [phone] Fax:([phone]  Patient Care Team: Caitlyn Fabry, MD as PCP - General (Family Medicine)   Name of the patient: Caitlyn Elliott  [id number]  [birth year]   Date of visit: 10/24/17  Diagnosis- h/o PE and stroke  Chief complaint/ Reason for visit- anticoagulation recommendations  Heme/Onc history: patient is a 47 year old female who had PE back in 2004.  This was an unprovoked PE and there was no proceeding air travel, surgery or episodes of immobilization.  She was not on birth control pills at that time.  She was on Coumadin for about a year and was later stopped.  She also saw Dr. Johnna Elliott from [facility] and had a factor V Leiden and prothrombin gene mutation testing back then which was negative.  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  She follows up with Dr. Dayna Elliott for her INR management.  Reports that she was getting her INR checked about once a month and it was therapeutic in the past.  Recently there has been some change in the INR monitoring strips per patient leading to dose adjustments and her INR has been fluctuating.  She was recently admitted to the hospital on 09/04/2017 with symptoms of a aphasia which was thought to be secondary to TIA.  She underwent a CT head as well as MRI and MRA of the brain which did not show any acute infarct.  Of note her INR was subtherapeutic on admission.  Patient is yet to see neurology as an outpatient and continues to be on Coumadin.  She has been referred to [place] for hypercoagulability workup.  Prior to 2004 patient has not had any DVT or PE.  She is also not had any thrombotic events other than her stroke in 2009 while on Coumadin.  She denies any pregnancy losses or family history of blood clots.  She is wondering if she should switch to an alternative newer  anticoagulant given her recent TIA   Blood work from 10/11/2017 did not reveal any evidence of antiphospholipid antibody syndrome.  C-reactive protein was normal.  ANA showed positive scleroderma antibody IgG elevated at 2.8 which was similar to what she had a year before  Interval history-patient is on Coumadin and tolerating it well without any signs of bleeding.  Her INR was checked last week and states that that was 2.5  ECOG PS- 0 Pain scale- 0   Review of systems- Review of Systems  Constitutional: Negative for chills, fever, malaise/fatigue and weight loss.  HENT: Negative for congestion, ear discharge and nosebleeds.   Eyes: Negative for blurred vision.  Respiratory: Negative for cough, hemoptysis, sputum production, shortness of breath and wheezing.   Cardiovascular: Negative for chest pain, palpitations, orthopnea and claudication.  Gastrointestinal: Negative for abdominal pain, blood in stool, constipation, diarrhea, heartburn, melena, nausea and vomiting.  Genitourinary: Negative for dysuria, flank pain, frequency, hematuria and urgency.  Musculoskeletal: Negative for back pain, joint pain and myalgias.  Skin: Negative for rash.  Neurological: Negative for dizziness, tingling, focal weakness, seizures, weakness and headaches.  Endo/Heme/Allergies: Does not bruise/bleed easily.  Psychiatric/Behavioral: Negative for depression and suicidal ideas. The patient does not have insomnia.       Allergies  Allergen Reactions  . Iron Rash    Slow FE iron OTC [facility] brand     Past Medical History:  Diagnosis Date  .  Anemia   . Gout 03/2017  . History of blood clots   . Hypertension   . Stroke (HCC)   . Thyroid disease      History reviewed. No pertinent surgical history.  Social History   Socioeconomic History  . Marital status: Married    Spouse name: Not on file  . Number of children: 2  . Years of education: Not on file  . Highest education level: 12th  grade  Social Needs  . Financial resource strain: Not hard at all  . Food insecurity - worry: Never true  . Food insecurity - inability: Never true  . Transportation needs - medical: No  . Transportation needs - non-medical: No  Occupational History  . Not on file  Tobacco Use  . Smoking status: Never Smoker  . Smokeless tobacco: Never Used  Substance and Sexual Activity  . Alcohol use: No    Frequency: Never  . Drug use: No  . Sexual activity: Yes    Birth control/protection: Surgical  Other Topics Concern  . Not on file  Social History Narrative   Lives with husband at home    Family History  Problem Relation Age of Onset  . Diabetes Mother   . Diabetes Sister   . Stroke Maternal Grandmother      Current Outpatient Medications:  .  allopurinol (ZYLOPRIM) 300 MG tablet, Take 300 mg daily by mouth., Disp: , Rfl:  .  levothyroxine (SYNTHROID, LEVOTHROID) 112 MCG tablet, TAKE 1 TABLET BY MOUTH ONCE DAILY, Disp: , Rfl:  .  triamterene-hydrochlorothiazide (DYAZIDE) 37.5-25 MG per capsule, Take 1 capsule by mouth daily. , Disp: , Rfl:  .  warfarin (COUMADIN) 5 MG tablet, Take 5 mg by mouth daily. Patient takes varied doses of warfarin based on her INR levels, Disp: , Rfl:  .  enoxaparin (LOVENOX) 120 MG/0.8ML injection, Inject 0.78 mLs (115 mg total) every 12 (twelve) hours for 5 days into the skin. (Patient not taking: Reported on 10/11/2017), Disp: 10 Syringe, Rfl: 0  Physical exam:  Vitals:   10/24/17 1439  BP: 103/72  Pulse: 86  Resp: 18  Temp: 97.6 F (36.4 C)  TempSrc: Oral  Weight: 257 lb 11.2 oz (116.9 kg)  Height: 5\' 2"  (1.575 m)   Physical Exam  Constitutional: She is oriented to person, place, and time and well-developed, well-nourished, and in no distress.  HENT:  Head: Normocephalic and atraumatic.  Eyes: EOM are normal. Pupils are equal, round, and reactive to light.  Neck: Normal range of motion.  Cardiovascular: Normal rate, regular rhythm and  normal heart sounds.  Pulmonary/Chest: Effort normal and breath sounds normal.  Abdominal: Soft. Bowel sounds are normal.  Neurological: She is alert and oriented to person, place, and time.  Skin: Skin is warm and dry.     CMP Latest Ref Rng & Units 09/03/2017  Glucose 65 - 99 mg/dL 027(O)  BUN 6 - 20 mg/dL 53(G)  Creatinine 6.44 - 1.00 mg/dL 0.34(V)  Sodium 425 - 956 mmol/L 135  Potassium 3.5 - 5.1 mmol/L 3.3(L)  Chloride 101 - 111 mmol/L 100(L)  CO2 22 - 32 mmol/L 24  Calcium 8.9 - 10.3 mg/dL 9.2  Total Protein 6.5 - 8.1 g/dL 7.6  Total Bilirubin 0.3 - 1.2 mg/dL 0.9  Alkaline Phos 38 - 126 U/L 62  AST 15 - 41 U/L 24  ALT 14 - 54 U/L 17   CBC Latest Ref Rng & Units 09/03/2017  WBC 3.6 - 11.0 K/uL  7.2  Hemoglobin 12.0 - 16.0 g/dL 47.814.5  Hematocrit 29.535.0 - 47.0 % 43.1  Platelets 150 - 440 K/uL 234    Assessment and plan- Patient is a 47 y.o. female with history of PE unprovoked in 2004 followed by stroke in 2009  Results of her hypercoagulability testing for antiphospholipid antibody syndrome did not reveal any evidence for the same.  She is already had a  factor V Leiden and prothrombin gene testing done in the past which was negative.  I would not recommend checking Antithrombin III, protein C protein S levels while on anticoagulation.  The cause of her PE as well as a stroke followed by recent TIA is currently uncertain.  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  I doubt that vasculitis is contributing to her recurrent thromboembolic episodes and her recent CRP was also normal.  Patient does see Dr. Ruthine DoseBehlal Bock for her arthritis and I will touch base with her regarding her repeat positive anti-scleroderma antibody to see if this would need any further workup   I would recommend that she should remain on anticoagulation.  With regards to what anticoagulation to use Coumadin versus NOAC-I would defer this to neurology as her  anticoagulation  presently is for her stroke and TIA.  Her INR was subtherapeutic when she presented to the hospital with symptoms of TIA and she has not had any thromboembolic event between 2009 until recently.  Patient will give us a call in 1 week's time if she does not hear back from neurology office.  At this time patient can continue to follow-up with her PCP and will remain on Coumadin unless neurology recommends to change that     Visit Diagnosis 1. Other chronic pulmonary embolism without acute cor pulmonale (HCC)   2. Long term current use of anticoagulant therapy      Dr. Owens SharkArchana Jasir Rother, MD, MPH [facility] Pager- [phone] 10/24/2017 3:56 PM

## 2017-10-24 NOTE — Progress Notes (Signed)
Pt here to get results of work up

## 2020-05-26 ENCOUNTER — Other Ambulatory Visit: Payer: Self-pay | Admitting: Family Medicine

## 2020-05-26 DIAGNOSIS — Z1231 Encounter for screening mammogram for malignant neoplasm of breast: Secondary | ICD-10-CM

## 2020-06-01 ENCOUNTER — Other Ambulatory Visit: Payer: Self-pay | Admitting: Family Medicine

## 2020-06-01 ENCOUNTER — Ambulatory Visit
Admission: RE | Admit: 2020-06-01 | Discharge: 2020-06-01 | Disposition: A | Discharge status: Home / Self Care | Payer: BC Managed Care – PPO | Source: Ambulatory Visit | Attending: Family Medicine | Admitting: Family Medicine

## 2020-06-01 ENCOUNTER — Other Ambulatory Visit: Payer: Self-pay

## 2020-06-01 DIAGNOSIS — Z1231 Encounter for screening mammogram for malignant neoplasm of breast: Secondary | ICD-10-CM

## 2020-06-03 ENCOUNTER — Other Ambulatory Visit: Payer: Self-pay | Admitting: Family Medicine

## 2020-06-03 DIAGNOSIS — R928 Other abnormal and inconclusive findings on diagnostic imaging of breast: Secondary | ICD-10-CM

## 2020-06-17 ENCOUNTER — Ambulatory Visit
Admission: RE | Admit: 2020-06-17 | Discharge: 2020-06-17 | Disposition: A | Discharge status: Home / Self Care | Payer: BC Managed Care – PPO | Source: Ambulatory Visit | Attending: Family Medicine | Admitting: Family Medicine

## 2020-06-17 ENCOUNTER — Other Ambulatory Visit: Payer: Self-pay

## 2020-06-17 DIAGNOSIS — R928 Other abnormal and inconclusive findings on diagnostic imaging of breast: Secondary | ICD-10-CM | POA: Diagnosis not present

## 2020-09-01 ENCOUNTER — Other Ambulatory Visit: Payer: Self-pay | Admitting: Family Medicine

## 2020-09-01 DIAGNOSIS — K8050 Calculus of bile duct without cholangitis or cholecystitis without obstruction: Secondary | ICD-10-CM

## 2020-09-01 DIAGNOSIS — R1032 Left lower quadrant pain: Secondary | ICD-10-CM

## 2020-09-01 DIAGNOSIS — Z8742 Personal history of other diseases of the female genital tract: Secondary | ICD-10-CM

## 2020-09-02 ENCOUNTER — Ambulatory Visit
Admission: RE | Admit: 2020-09-02 | Discharge: 2020-09-02 | Disposition: A | Discharge status: Home / Self Care | Payer: BC Managed Care – PPO | Source: Ambulatory Visit | Attending: Family Medicine | Admitting: Family Medicine

## 2020-09-02 ENCOUNTER — Other Ambulatory Visit: Payer: Self-pay | Admitting: Family Medicine

## 2020-09-02 ENCOUNTER — Other Ambulatory Visit: Payer: Self-pay

## 2020-09-02 DIAGNOSIS — R1032 Left lower quadrant pain: Secondary | ICD-10-CM | POA: Insufficient documentation

## 2020-09-02 DIAGNOSIS — K8050 Calculus of bile duct without cholangitis or cholecystitis without obstruction: Secondary | ICD-10-CM

## 2020-09-02 DIAGNOSIS — Z8742 Personal history of other diseases of the female genital tract: Secondary | ICD-10-CM

## 2020-09-02 DIAGNOSIS — Z87442 Personal history of urinary calculi: Secondary | ICD-10-CM | POA: Insufficient documentation

## 2020-09-12 ENCOUNTER — Other Ambulatory Visit (HOSPITAL_COMMUNITY): Payer: Self-pay | Admitting: Surgery

## 2020-09-12 ENCOUNTER — Other Ambulatory Visit: Payer: Self-pay | Admitting: Surgery

## 2020-09-12 ENCOUNTER — Inpatient Hospital Stay: Payer: BC Managed Care – PPO

## 2020-09-12 ENCOUNTER — Encounter: Payer: Self-pay | Admitting: Oncology

## 2020-09-12 ENCOUNTER — Inpatient Hospital Stay: Payer: BC Managed Care – PPO | Attending: Oncology | Admitting: Oncology

## 2020-09-12 VITALS — BP 123/80 | HR 85 | Temp 98.2°F | Resp 16 | Wt 278.0 lb

## 2020-09-12 DIAGNOSIS — Z8673 Personal history of transient ischemic attack (TIA), and cerebral infarction without residual deficits: Secondary | ICD-10-CM | POA: Diagnosis not present

## 2020-09-12 DIAGNOSIS — Z7901 Long term (current) use of anticoagulants: Secondary | ICD-10-CM | POA: Insufficient documentation

## 2020-09-12 DIAGNOSIS — D751 Secondary polycythemia: Secondary | ICD-10-CM | POA: Diagnosis not present

## 2020-09-12 DIAGNOSIS — Z86711 Personal history of pulmonary embolism: Secondary | ICD-10-CM | POA: Insufficient documentation

## 2020-09-12 DIAGNOSIS — K8050 Calculus of bile duct without cholangitis or cholecystitis without obstruction: Secondary | ICD-10-CM

## 2020-09-12 LAB — CBC WITH DIFFERENTIAL/PLATELET
Abs Immature Granulocytes: 0.03 10*3/uL (ref 0.00–0.07)
Basophils Absolute: 0 10*3/uL (ref 0.0–0.1)
Basophils Relative: 0 %
Eosinophils Absolute: 0.1 10*3/uL (ref 0.0–0.5)
Eosinophils Relative: 1 %
HCT: 45 % (ref 36.0–46.0)
Hemoglobin: 15.1 g/dL — ABNORMAL HIGH (ref 12.0–15.0)
Immature Granulocytes: 0 %
Lymphocytes Relative: 24 %
Lymphs Abs: 2.3 10*3/uL (ref 0.7–4.0)
MCH: 29.7 pg (ref 26.0–34.0)
MCHC: 33.6 g/dL (ref 30.0–36.0)
MCV: 88.4 fL (ref 80.0–100.0)
Monocytes Absolute: 0.9 10*3/uL (ref 0.1–1.0)
Monocytes Relative: 9 %
Neutro Abs: 6.3 10*3/uL (ref 1.7–7.7)
Neutrophils Relative %: 66 %
Platelets: 186 10*3/uL (ref 150–400)
RBC: 5.09 MIL/uL (ref 3.87–5.11)
RDW: 14.5 % (ref 11.5–15.5)
WBC: 9.6 10*3/uL (ref 4.0–10.5)
nRBC: 0 % (ref 0.0–0.2)

## 2020-09-12 LAB — URINALYSIS, COMPLETE (UACMP) WITH MICROSCOPIC
Bacteria, UA: NONE SEEN
Bilirubin Urine: NEGATIVE
Glucose, UA: NEGATIVE mg/dL
Hgb urine dipstick: NEGATIVE
Ketones, ur: NEGATIVE mg/dL
Nitrite: NEGATIVE
Protein, ur: NEGATIVE mg/dL
Specific Gravity, Urine: 1.014 (ref 1.005–1.030)
pH: 6 (ref 5.0–8.0)

## 2020-09-12 NOTE — Progress Notes (Signed)
Pt here to f/u on PE. Pt now on eliquis and doing fine on the pill. No sx. Of PE at this time

## 2020-09-13 LAB — ERYTHROPOIETIN: Erythropoietin: 13.5 m[IU]/mL (ref 2.6–18.5)

## 2020-09-18 NOTE — Progress Notes (Signed)
Hematology/Oncology Consult note Caitlyn Elliott  Telephone:([phone]) [phone] [phone]  Patient Care Team: Leim FabryAldridge, Barbara, MD as PCP - General (Family Medicine)   Name of the patient: Caitlyn [place] Victorian  [id number]  10/23/1970   Date of visit: 09/18/20  Diagnosis- h/o PE and stroke  Chief complaint/ Reason for visit- now referred for polycythemia  Heme/Onc history: patient is a 49 year old female who had PE back in 2004. This was an unprovoked PE and there was no proceeding air travel, surgery or episodes of immobilization. She was not on birth control pills at that time. She was on Coumadin for about a year and was later stopped. She also saw [facility]fan [facility] and had a factor V Leiden and prothrombin gene mutation testing back then which was negative. 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 She follows up with Dr. Dayna BarkerAldridge for her INR management. Reports that she was getting her INR checked about once a month and it was therapeutic in the past. Recently there has been some change in the INR monitoring strips per patient leading to dose adjustments and her INR has been fluctuating. She was recently admitted to the hospital on 09/04/2017 with symptoms of a aphasia which was thought to be secondary to TIA. She underwent a CT head as well as MRI and MRA of the brain which did not show any acute infarct. Of note her INR was subtherapeutic on admission.  Patient is yet to see neurology as an outpatient and continues to be on Coumadin. She has been referred to us for hypercoagulability workup. Prior to 2004 patient has not had any DVT or PE. She is also not had any thrombotic events other than her stroke in 2009 while on Coumadin. She denies any pregnancy losses or family history of blood clots.  Currently patient is on Eliquis  Interval history-patient  reports doing well and denies any specific complaints at this time.  ECOG PS- 1 Pain scale- 0   Review of systems- Review of Systems  Constitutional: Negative for chills, fever, malaise/fatigue and weight loss.  HENT: Negative for congestion, ear discharge and nosebleeds.   Eyes: Negative for blurred vision.  Respiratory: Negative for cough, hemoptysis, sputum production, shortness of breath and wheezing.   Cardiovascular: Negative for chest pain, palpitations, orthopnea and claudication.  Gastrointestinal: Negative for abdominal pain, blood in stool, constipation, diarrhea, heartburn, melena, nausea and vomiting.  Genitourinary: Negative for dysuria, flank pain, frequency, hematuria and urgency.  Musculoskeletal: Negative for back pain, joint pain and myalgias.  Skin: Negative for rash.  Neurological: Negative for dizziness, tingling, focal weakness, seizures, weakness and headaches.  Endo/Heme/Allergies: Does not bruise/bleed easily.  Psychiatric/Behavioral: Negative for depression and suicidal ideas. The patient does not have insomnia.       Allergies  Allergen Reactions  . Iron Rash    Slow FE iron OTC [facility] brand     Past Medical History:  Diagnosis Date  . Anemia   . Gout 03/2017  . History of blood clots   . Hypertension   . Stroke (HCC)   . Thyroid disease      No past surgical history on file.  Social History   Socioeconomic History  . Marital status: Married    Spouse name: Not on file  . Number of children: 2  . Years of education: Not on file  . Highest education level:  12th grade  Occupational History  . Not on file  Tobacco Use  . Smoking status: Never Smoker  . Smokeless tobacco: Never Used  Vaping Use  . Vaping Use: Never used  Substance and Sexual Activity  . Alcohol use: No  . Drug use: No  . Sexual activity: Yes    Birth control/protection: Surgical  Other Topics Concern  . Not on file  Social History Narrative   Lives with  husband at home   Social Determinants of Health   Financial Resource Strain:   . Difficulty of Paying Living Expenses: Not on file  Food Insecurity:   . Worried About Programme researcher, broadcasting/film/video in the Last Year: Not on file  . Ran Out of Food in the Last Year: Not on file  Transportation Needs:   . Lack of Transportation (Medical): Not on file  . Lack of Transportation (Non-Medical): Not on file  Physical Activity:   . Days of Exercise per Week: Not on file  . Minutes of Exercise per Session: Not on file  Stress:   . Feeling of Stress : Not on file  Social Connections:   . Frequency of Communication with Friends and Family: Not on file  . Frequency of Social Gatherings with Friends and Family: Not on file  . Attends Religious Services: Not on file  . Active Member of Clubs or Organizations: Not on file  . Attends Banker Meetings: Not on file  . Marital Status: Not on file  Intimate Partner Violence:   . Fear of Current or Ex-Partner: Not on file  . Emotionally Abused: Not on file  . Physically Abused: Not on file  . Sexually Abused: Not on file    Family History  Problem Relation Age of Onset  . Diabetes Mother   . Diabetes Sister   . Stroke Maternal Grandmother      Current Outpatient Medications:  .  allopurinol (ZYLOPRIM) 100 MG tablet, Take 100 mg by mouth daily., Disp: , Rfl:  .  allopurinol (ZYLOPRIM) 300 MG tablet, Take 300 mg daily by mouth., Disp: , Rfl:  .  apixaban (ELIQUIS) 5 MG TABS tablet, Take 5 mg by mouth 2 (two) times daily., Disp: , Rfl:  .  atorvastatin (LIPITOR) 10 MG tablet, Take 10 mg by mouth daily., Disp: , Rfl:  .  levothyroxine (SYNTHROID, LEVOTHROID) 112 MCG tablet, TAKE 1 TABLET BY MOUTH ONCE DAILY, Disp: , Rfl:  .  triamterene-hydrochlorothiazide (DYAZIDE) 37.5-25 MG per capsule, Take 1 capsule by mouth daily. , Disp: , Rfl:   Physical exam:  Vitals:   09/12/20 1515  BP: 123/80  Pulse: 85  Resp: 16  Temp: 98.2 F (36.8 C)   Weight: 278 lb (126.1 kg)   Physical Exam Cardiovascular:     Rate and Rhythm: Normal rate and regular rhythm.     Heart sounds: Normal heart sounds.  Pulmonary:     Effort: Pulmonary effort is normal.     Breath sounds: Normal breath sounds.  Abdominal:     General: Bowel sounds are normal.     Palpations: Abdomen is soft.     Comments: No palpable splenomegaly  Skin:    General: Skin is warm and dry.  Neurological:     Mental Status: She is alert and oriented to person, place, and time.      CMP Latest Ref Rng & Units 09/03/2017  Glucose 65 - 99 mg/dL 400(Q)  BUN 6 - 20 mg/dL 67(Y)  Creatinine  0.44 - 1.00 mg/dL 9.76(B)  Sodium 341 - 937 mmol/L 135  Potassium 3.5 - 5.1 mmol/L 3.3(L)  Chloride 101 - 111 mmol/L 100(L)  CO2 22 - 32 mmol/L 24  Calcium 8.9 - 10.3 mg/dL 9.2  Total Protein 6.5 - 8.1 g/dL 7.6  Total Bilirubin 0.3 - 1.2 mg/dL 0.9  Alkaline Phos 38 - 126 U/L 62  AST 15 - 41 U/L 24  ALT 14 - 54 U/L 17   CBC Latest Ref Rng & Units 09/12/2020  WBC 4.0 - 10.5 K/uL 9.6  Hemoglobin 12.0 - 15.0 g/dL 15.1(H)  Hematocrit 36 - 46 % 45.0  Platelets 150 - 400 K/uL 186    No images are attached to the encounter.  US Abdomen Complete  Result Date: 09/02/2020 CLINICAL DATA:  Acute right upper quadrant abdominal pain. EXAM: ABDOMEN ULTRASOUND COMPLETE COMPARISON:  August 25, 2009. FINDINGS: Gallbladder: Cholelithiasis is noted without gallbladder wall thickening or pericholecystic fluid. No sonographic Murphy's sign is noted. Common bile duct: Diameter: 7 mm which is within normal limits. Liver: No focal lesion identified. Within normal limits in parenchymal echogenicity. Portal vein is patent on color Doppler imaging with normal direction of blood flow towards the liver. IVC: No abnormality visualized. Pancreas: Visualized portion appears unremarkable. Spleen: Size and appearance within normal limits. Right Kidney: Length: 12.4 cm. Echogenicity within normal limits. No mass  or hydronephrosis visualized. Left Kidney: Length: 12 cm. Echogenicity within normal limits. No mass or hydronephrosis visualized. Abdominal aorta: No aneurysm visualized. Other findings: None. IMPRESSION: Cholelithiasis without evidence of cholecystitis. No other definite abnormality seen in the abdomen. Electronically Signed   By: Lupita Raider M.D.   On: 09/02/2020 11:32   US PELVIC COMPLETE WITH TRANSVAGINAL  Result Date: 09/02/2020 CLINICAL DATA:  Ovarian cyst EXAM: TRANSABDOMINAL AND TRANSVAGINAL ULTRASOUND OF PELVIS TECHNIQUE: Both transabdominal and transvaginal ultrasound examinations of the pelvis were performed. Transabdominal technique was performed for global imaging of the pelvis including uterus, ovaries, adnexal regions, and pelvic cul-de-sac. It was necessary to proceed with endovaginal exam following the transabdominal exam to visualize the endometrium and ovaries. COMPARISON:  08/16/2014 FINDINGS: Uterus Measurements: 9 x 7 x 7 cm = volume: 233 mL. Heterogeneous intramural mass in the posterior body most consistent with fibroid. Margins are somewhat indistinct but size is best estimated at 4.7 cm. Endometrium Thickness: 5 mm.  No focal abnormality visualized. Right ovary Measurements: 23 x 15 x 14 mm = volume: 2.5 mL. Normal appearance/no adnexal mass. Left ovary Measurements: 28 x 15 x 25 mm = volume: 5.5 mL. 13 mm simple appearing cyst, likely follicular in this near menopause age patient Other findings No abnormal free fluid. IMPRESSION: 1. 13 mm left ovarian cyst, simple and likely functional at this size. No followup imaging recommended. Note: This recommendation does not apply to those with increased risk (genetic, family history, elevated tumor markers or other high-risk factors) of ovarian cancer. Reference: Radiology 2019 Nov; 293(2):359-371. 2. Intramural fibroid measuring nearly 5 cm. Electronically Signed   By: Marnee Spring M.D.   On: 09/02/2020 11:30     Assessment and  plan- Patient is a 49 y.o. female with a history of PE and stroke in the past on Eliquis now referred for polycythemia  Patient's most recent CBC from 09/01/2020 showed an H&H of 16/47.5.  Prior to that in August her H&H was 16.3/48.3.  Her prior hemoglobin from 2015 in 2018 have been within normal limits.  Discussed with patient differences between polycythemia vera and  secondary polycythemia.  Patient presently has a mild polycythemia given that her hemoglobin is just above 16.  Today I will check a CBC with differential, EPO, JAK2 mutation and urinalysis.  Video visit with her in 2 weeks time   Visit Diagnosis 1. Polycythemia      Dr. Owens Shark, MD, MPH [facility] at [facility] [phone] 09/18/2020 7:09 PM

## 2020-09-19 ENCOUNTER — Encounter: Payer: Self-pay | Admitting: Oncology

## 2020-09-22 LAB — JAK2 GENOTYPR

## 2020-09-27 ENCOUNTER — Encounter: Payer: Self-pay | Admitting: Oncology

## 2020-09-27 ENCOUNTER — Inpatient Hospital Stay: Payer: BC Managed Care – PPO | Attending: Oncology | Admitting: Oncology

## 2020-09-27 DIAGNOSIS — D751 Secondary polycythemia: Secondary | ICD-10-CM | POA: Diagnosis not present

## 2020-09-30 ENCOUNTER — Other Ambulatory Visit: Payer: Self-pay | Admitting: Oncology

## 2020-09-30 NOTE — Progress Notes (Signed)
I connected with Caitlyn Elliott on 09/30/20 at  2:45 PM EST by video enabled telemedicine visit and verified that I am speaking with the correct person using two identifiers.   I discussed the limitations, risks, security and privacy concerns of performing an evaluation and management service by telemedicine and the availability of in-person appointments. I also discussed with the Caitlyn Elliott that there may be a Caitlyn Elliott responsible charge related to this service. The Caitlyn Elliott expressed understanding and agreed to proceed.  Other persons participating in the visit and their role in the encounter:  none  Caitlyn Elliott's location:  home Provider's location:  work  Stage manager Complaint: Discuss results of blood work  History of present illness: Caitlyn Elliott is a 49 year old female who had PE back in 2004. This was an unprovoked PE and there was no proceeding air travel, surgery or episodes of immobilization. Caitlyn Elliott was not on birth control pills at that time. Caitlyn Elliott was on Coumadin for about a year and was later stopped. Caitlyn Elliott also saw Dr. Johnna Acosta from [facility] and had a factor V Leiden and prothrombin gene mutation testing back then which was negative. Then again in 2009 Caitlyn Elliott had left-sided hemiparesis from a stroke and was restarted on Coumadin and has been on Coumadin since then. Caitlyn Elliott follows up with Dr. Dayna Barker for her INR management. Reports that Caitlyn Elliott was getting her INR checked about once a month and it was therapeutic in the past. Recently there has been some change in the INR monitoring strips per Caitlyn Elliott leading to dose adjustments and her INR has been fluctuating. Caitlyn Elliott was recently admitted to the hospital on 09/04/2017 with symptoms of a aphasia which was thought to be secondary to TIA. Caitlyn Elliott underwent a CT head as well as MRI and MRA of the brain which did not show any acute infarct. Of note her INR was subtherapeutic on admission.  Caitlyn Elliott is yet to see neurology as an outpatient and continues to be on Coumadin.  Caitlyn Elliott has been referred to [place] for hypercoagulability workup. Prior to 2004 Caitlyn Elliott has not had any DVT or PE. Caitlyn Elliott is also not had any thrombotic events other than her stroke in 2009 while on Coumadin. Caitlyn Elliott denies any pregnancy losses or family history of blood clots.  Currently Caitlyn Elliott is on Eliquis  Caitlyn Elliott rereferred in November 2021 for polycythemia.  Caitlyn Elliott does have a history of obstructive sleep apnea but does not use CPAP consistently.Results of blood work from 09/12/2020 were as follows: CBC showed a white count of 9.6, H&H of 15.1/45 and a platelet count of 186.  EPO levels were normal at 13.5 and JAK2 mutation testing was negative.  Urinalysis did not show any hematuria.    Interval history no acute issues since last visit   Review of Systems  Constitutional: Positive for malaise/fatigue.    Allergies  Allergen Reactions  . Iron Rash    Slow FE iron OTC [facility] brand    Past Medical History:  Diagnosis Date  . Anemia   . Gout 03/2017  . History of blood clots   . Hypertension   . Stroke (HCC)   . Thyroid disease     History reviewed. No pertinent surgical history.  Social History   Socioeconomic History  . Marital status: Married    Spouse name: Not on file  . Number of children: 2  . Years of education: Not on file  . Highest education level: 12th grade  Occupational History  . Not on file  Tobacco Use  .  Smoking status: Never Smoker  . Smokeless tobacco: Never Used  Vaping Use  . Vaping Use: Never used  Substance and Sexual Activity  . Alcohol use: No  . Drug use: No  . Sexual activity: Yes    Birth control/protection: Surgical  Other Topics Concern  . Not on file  Social History Narrative   Lives with husband at home   Social Determinants of Health   Financial Resource Strain: Not on file  Food Insecurity: Not on file  Transportation Needs: Not on file  Physical Activity: Not on file  Stress: Not on file  Social Connections: Not on file   Intimate Partner Violence: Not on file    Family History  Problem Relation Age of Onset  . Diabetes Mother   . Diabetes Sister   . Stroke Maternal Grandmother      Current Outpatient Medications:  .  allopurinol (ZYLOPRIM) 100 MG tablet, Take 100 mg by mouth daily., Disp: , Rfl:  .  allopurinol (ZYLOPRIM) 300 MG tablet, Take 300 mg daily by mouth., Disp: , Rfl:  .  apixaban (ELIQUIS) 5 MG TABS tablet, Take 5 mg by mouth 2 (two) times daily., Disp: , Rfl:  .  atorvastatin (LIPITOR) 10 MG tablet, Take 10 mg by mouth daily., Disp: , Rfl:  .  levothyroxine (SYNTHROID, LEVOTHROID) 112 MCG tablet, TAKE 1 TABLET BY MOUTH ONCE DAILY, Disp: , Rfl:  .  triamterene-hydrochlorothiazide (DYAZIDE) 37.5-25 MG per capsule, Take 1 capsule by mouth daily. , Disp: , Rfl:   US Abdomen Complete  Result Date: 09/02/2020 CLINICAL DATA:  Acute right upper quadrant abdominal pain. EXAM: ABDOMEN ULTRASOUND COMPLETE COMPARISON:  August 25, 2009. FINDINGS: Gallbladder: Cholelithiasis is noted without gallbladder wall thickening or pericholecystic fluid. No sonographic Murphy's sign is noted. Common bile duct: Diameter: 7 mm which is within normal limits. Liver: No focal lesion identified. Within normal limits in parenchymal echogenicity. Portal vein is patent on color Doppler imaging with normal direction of blood flow towards the liver. IVC: No abnormality visualized. Pancreas: Visualized portion appears unremarkable. Spleen: Size and appearance within normal limits. Right Kidney: Length: 12.4 cm. Echogenicity within normal limits. No mass or hydronephrosis visualized. Left Kidney: Length: 12 cm. Echogenicity within normal limits. No mass or hydronephrosis visualized. Abdominal aorta: No aneurysm visualized. Other findings: None. IMPRESSION: Cholelithiasis without evidence of cholecystitis. No other definite abnormality seen in the abdomen. Electronically Signed   By: Lupita Raider M.D.   On: 09/02/2020 11:32   US  PELVIC COMPLETE WITH TRANSVAGINAL  Result Date: 09/02/2020 CLINICAL DATA:  Ovarian cyst EXAM: TRANSABDOMINAL AND TRANSVAGINAL ULTRASOUND OF PELVIS TECHNIQUE: Both transabdominal and transvaginal ultrasound examinations of the pelvis were performed. Transabdominal technique was performed for global imaging of the pelvis including uterus, ovaries, adnexal regions, and pelvic cul-de-sac. It was necessary to proceed with endovaginal exam following the transabdominal exam to visualize the endometrium and ovaries. COMPARISON:  08/16/2014 FINDINGS: Uterus Measurements: 9 x 7 x 7 cm = volume: 233 mL. Heterogeneous intramural mass in the posterior body most consistent with fibroid. Margins are somewhat indistinct but size is best estimated at 4.7 cm. Endometrium Thickness: 5 mm.  No focal abnormality visualized. Right ovary Measurements: 23 x 15 x 14 mm = volume: 2.5 mL. Normal appearance/no adnexal mass. Left ovary Measurements: 28 x 15 x 25 mm = volume: 5.5 mL. 13 mm simple appearing cyst, likely follicular in this near menopause age Caitlyn Elliott Other findings No abnormal free fluid. IMPRESSION: 1. 13 mm left ovarian  cyst, simple and likely functional at this size. No followup imaging recommended. Note: This recommendation does not apply to those with increased risk (genetic, family history, elevated tumor markers or other high-risk factors) of ovarian cancer. Reference: Radiology 2019 Nov; 293(2):359-371. 2. Intramural fibroid measuring nearly 5 cm. Electronically Signed   By: Marnee Spring M.D.   On: 09/02/2020 11:30    No images are attached to the encounter.   CMP Latest Ref Rng & Units 09/03/2017  Glucose 65 - 99 mg/dL 623(J)  BUN 6 - 20 mg/dL 62(G)  Creatinine 3.15 - 1.00 mg/dL 1.76(H)  Sodium 607 - 371 mmol/L 135  Potassium 3.5 - 5.1 mmol/L 3.3(L)  Chloride 101 - 111 mmol/L 100(L)  CO2 22 - 32 mmol/L 24  Calcium 8.9 - 10.3 mg/dL 9.2  Total Protein 6.5 - 8.1 g/dL 7.6  Total Bilirubin 0.3 - 1.2 mg/dL  0.9  Alkaline Phos 38 - 126 U/L 62  AST 15 - 41 U/L 24  ALT 14 - 54 U/L 17   CBC Latest Ref Rng & Units 09/12/2020  WBC 4.0 - 10.5 K/uL 9.6  Hemoglobin 12.0 - 15.0 g/dL 15.1(H)  Hematocrit 36.0 - 46.0 % 45.0  Platelets 150 - 400 K/uL 186     Observation/objective: Appears in no acute distress over video visit today.  Breathing is nonlabored  Assessment and plan:Caitlyn Elliott is a 49 year old female with prior history of PE and stroke on Eliquis now referred for polycythemia  Caitlyn Elliott's hemoglobin is mildly elevated at 15.1 and does not quite meet criteria for polycythemia.  JAK2 mutation was negative and EPO levels were normal.  This argues against P vera.  Urinalysis was also normal and therefore this is unlikely to be tumor associated polycythemia from renal cell carcinoma.  Suspect Caitlyn Elliott's mildly elevated hemoglobin is secondary to obstructive sleep apnea and noncompliance with CPAP.  Encouraged Caitlyn Elliott to speak to Dr. Dayna Barker to see how Caitlyn Elliott can be more compliant with CPAP and perhaps that would help her hemoglobin levels.  Follow-up instructions: CBC with differential and see me in 6 months  I discussed the assessment and treatment plan with the Caitlyn Elliott. The Caitlyn Elliott was provided an opportunity to ask questions and all were answered. The Caitlyn Elliott agreed with the plan and demonstrated an understanding of the instructions.   The Caitlyn Elliott was advised to call back or seek an in-person evaluation if the symptoms worsen or if the condition fails to improve as anticipated.   Visit Diagnosis: 1. Polycythemia     Dr. Owens Shark, MD, MPH [facility] at [facility] Tel- [phone] 09/30/2020 8:51 AM

## 2020-10-10 ENCOUNTER — Ambulatory Visit: Payer: BC Managed Care – PPO

## 2020-10-20 ENCOUNTER — Emergency Department: Payer: BC Managed Care – PPO

## 2020-10-20 ENCOUNTER — Other Ambulatory Visit: Payer: Self-pay

## 2020-10-20 ENCOUNTER — Encounter: Payer: Self-pay | Admitting: Emergency Medicine

## 2020-10-20 ENCOUNTER — Emergency Department
Admission: EM | Admit: 2020-10-20 | Discharge: 2020-10-20 | Disposition: A | Discharge status: Home / Self Care | Payer: BC Managed Care – PPO | Attending: Emergency Medicine | Admitting: Emergency Medicine

## 2020-10-20 DIAGNOSIS — U071 COVID-19: Secondary | ICD-10-CM | POA: Insufficient documentation

## 2020-10-20 DIAGNOSIS — Z79899 Other long term (current) drug therapy: Secondary | ICD-10-CM | POA: Diagnosis not present

## 2020-10-20 DIAGNOSIS — E039 Hypothyroidism, unspecified: Secondary | ICD-10-CM | POA: Insufficient documentation

## 2020-10-20 DIAGNOSIS — J189 Pneumonia, unspecified organism: Secondary | ICD-10-CM

## 2020-10-20 DIAGNOSIS — Z86711 Personal history of pulmonary embolism: Secondary | ICD-10-CM | POA: Diagnosis not present

## 2020-10-20 DIAGNOSIS — R079 Chest pain, unspecified: Secondary | ICD-10-CM | POA: Diagnosis present

## 2020-10-20 DIAGNOSIS — J181 Lobar pneumonia, unspecified organism: Secondary | ICD-10-CM | POA: Diagnosis not present

## 2020-10-20 DIAGNOSIS — I10 Essential (primary) hypertension: Secondary | ICD-10-CM | POA: Insufficient documentation

## 2020-10-20 LAB — CBC
HCT: 48.8 % — ABNORMAL HIGH (ref 36.0–46.0)
Hemoglobin: 16.8 g/dL — ABNORMAL HIGH (ref 12.0–15.0)
MCH: 28.8 pg (ref 26.0–34.0)
MCHC: 34.4 g/dL (ref 30.0–36.0)
MCV: 83.6 fL (ref 80.0–100.0)
Platelets: 168 K/uL (ref 150–400)
RBC: 5.84 MIL/uL — ABNORMAL HIGH (ref 3.87–5.11)
RDW: 14.4 % (ref 11.5–15.5)
WBC: 5.1 K/uL (ref 4.0–10.5)
nRBC: 0 % (ref 0.0–0.2)

## 2020-10-20 LAB — BASIC METABOLIC PANEL WITH GFR
Anion gap: 15 (ref 5–15)
BUN: 12 mg/dL (ref 6–20)
CO2: 26 mmol/L (ref 22–32)
Calcium: 8.9 mg/dL (ref 8.9–10.3)
Chloride: 92 mmol/L — ABNORMAL LOW (ref 98–111)
Creatinine, Ser: 1.08 mg/dL — ABNORMAL HIGH (ref 0.44–1.00)
GFR, Estimated: >60 mL/min (ref >60)
Glucose, Bld: 117 mg/dL — ABNORMAL HIGH (ref 70–99)
Potassium: 3.1 mmol/L — ABNORMAL LOW (ref 3.5–5.1)
Sodium: 133 mmol/L — ABNORMAL LOW (ref 135–145)

## 2020-10-20 LAB — RESP PANEL BY RT-PCR (FLU A&B, COVID) ARPGX2
Influenza A by PCR: NEGATIVE
Influenza B by PCR: NEGATIVE
SARS Coronavirus 2 by RT PCR: POSITIVE — AB

## 2020-10-20 LAB — TROPONIN I (HIGH SENSITIVITY)
Troponin I (High Sensitivity): 8 ng/L (ref <18)
Troponin I (High Sensitivity): 8 ng/L (ref <18)

## 2020-10-20 MED ORDER — DOXYCYCLINE HYCLATE 100 MG PO TABS
100.0000 mg | ORAL_TABLET | Freq: Once | ORAL | Status: AC
  Administered 2020-10-20: 18:00:00 100 mg via ORAL
  Filled 2020-10-20: qty 1

## 2020-10-20 MED ORDER — ACETAMINOPHEN 500 MG PO TABS
1000.0000 mg | ORAL_TABLET | Freq: Once | ORAL | Status: AC
  Administered 2020-10-20: 17:00:00 1000 mg via ORAL
  Filled 2020-10-20: qty 2

## 2020-10-20 MED ORDER — SODIUM CHLORIDE 0.9 % IV BOLUS
1000.0000 mL | Freq: Once | INTRAVENOUS | Status: AC
  Administered 2020-10-20: 18:00:00 1000 mL via INTRAVENOUS

## 2020-10-20 MED ORDER — DOXYCYCLINE HYCLATE 100 MG PO TABS: 100.0000 mg | ORAL_TABLET | Freq: Two times a day (BID) | ORAL | 0 refills | Status: AC

## 2020-10-20 MED ORDER — POTASSIUM CHLORIDE CRYS ER 20 MEQ PO TBCR
40.0000 meq | EXTENDED_RELEASE_TABLET | Freq: Once | ORAL | Status: AC
  Administered 2020-10-20: 17:00:00 40 meq via ORAL
  Filled 2020-10-20: qty 2

## 2020-10-20 NOTE — Discharge Instructions (Signed)
Check your MyChart results for your Covid PCR test results.  You are being discharged with a prescription for doxycycline antibiotic to take 2 times daily for the next 7 days to treat the possible pneumonia that we saw on your chest x-ray.  Please finish all 14 pills, even if your symptoms are getting better.  Continue all of your other medications.  Return to the ED with any worsening symptoms despite the above medications.

## 2020-10-20 NOTE — ED Triage Notes (Signed)
Pt comes into the ED via POV c/o chest pain that is centrally across the chest. Pt denies any radiation of pain and denies any SHOB.  Pt states she does have dizziness and Nausea.  Pt states that she took a home test for COVID on 10/15/20 and it came back positive.  Pt currently has even and unlabored respirations and is in NAD. Pt denies any cardiac history.

## 2020-10-20 NOTE — ED Provider Notes (Signed)
[facility] Emergency Department Provider Note ____________________________________________   Event Date/Time   First MD Initiated Contact with Patient 10/20/20 1659     (approximate)  I have reviewed the triage vital signs and the nursing notes.  HISTORY  Chief Complaint Chest Pain   HPI Caitlyn Elliott is a 49 y.o. femalewho presents to the ED for evaluation of chest pain.  Chart review indicates history of hypercoagulability on Eliquis.  HTN, HLD and hypothyroidism.  Patient presents to the ED with 2 days of constant lower chest/bilateral epigastric aching sensation, superimposed on 5 days of subjective chills, generalized weakness and watery diarrhea.  Patient reports taking a home COVID-19 test 5 days ago and this returned positive.  She presents to the ED largely because of the constant lower chest pain, and her having a low threshold for ED presentation due to her history of blood clots.  She reports that since she has initiated Eliquis, she has had no breakthrough clots and is adamant that she has not missed any doses.   Past Medical History:  Diagnosis Date  . Anemia   . Gout 03/2017  . History of blood clots   . Hypertension   . Stroke (HCC)   . Thyroid disease     Patient Active Problem List   Diagnosis Date Noted  . Aphasia 09/03/2017  . HLD (hyperlipidemia) 07/02/2014  . HTN (hypertension) 07/02/2014  . Pulmonary embolism (HCC) 07/02/2014  . Cerebral vascular accident (HCC) 07/02/2014  . Disease of thyroid gland 07/02/2014  . Lower urinary tract infection 09/15/2013  . History of anticoagulant therapy 06/28/2011    History reviewed. No pertinent surgical history.  Prior to Admission medications   Medication Sig Start Date End Date Taking? Authorizing Provider  doxycycline (VIBRA-TABS) 100 MG tablet Take 1 tablet (100 mg total) by mouth 2 (two) times daily for 7 days. 10/20/20 10/27/20 Yes Delton Prairie, MD  allopurinol (ZYLOPRIM)  100 MG tablet Take 100 mg by mouth daily.    [provider]  allopurinol (ZYLOPRIM) 300 MG tablet Take 300 mg daily by mouth.    [provider]  apixaban (ELIQUIS) 5 MG TABS tablet Take 5 mg by mouth 2 (two) times daily.    [provider]  atorvastatin (LIPITOR) 10 MG tablet Take 10 mg by mouth daily.    [provider]  levothyroxine (SYNTHROID, LEVOTHROID) 112 MCG tablet TAKE 1 TABLET BY MOUTH ONCE DAILY 02/04/13   [provider]  triamterene-hydrochlorothiazide (DYAZIDE) 37.5-25 MG per capsule Take 1 capsule by mouth daily.  09/15/13   [provider]    Allergies Iron  Family History  Problem Relation Age of Onset  . Diabetes Mother   . Diabetes Sister   . Stroke Maternal Grandmother     Social History Social History   Tobacco Use  . Smoking status: Never Smoker  . Smokeless tobacco: Never Used  Vaping Use  . Vaping Use: Never used  Substance Use Topics  . Alcohol use: No  . Drug use: No    Review of Systems  Constitutional: No fever/chills.  Positive generalized weakness Eyes: No visual changes. ENT: No sore throat. Cardiovascular: Positive for chest pain. Respiratory: Denies shortness of breath. Gastrointestinal: No abdominal pain.  No nausea, no vomiting.  No diarrhea.  No constipation. Genitourinary: Negative for dysuria. Musculoskeletal: Negative for back pain. Skin: Negative for rash. Neurological: Negative for focal weakness or numbness. ___________________________________________   PHYSICAL EXAM:  VITAL SIGNS: Vitals:  10/20/20 1427 10/20/20 1640  BP: 104/83 (!) 103/56  Pulse: (!) 108 (!) 105  Resp: 20 20  Temp: 98.7 F (37.1 C) 98.4 F (36.9 C)  SpO2: 96% 95%     Constitutional: Alert and oriented. Well appearing and in no acute distress.  Conversational in full sentences. Eyes: Conjunctivae are normal. PERRL. EOMI. Head: Atraumatic. Nose: No congestion/rhinnorhea. Mouth/Throat:  Mucous membranes are dry.  Oropharynx non-erythematous. Neck: No stridor. No cervical spine tenderness to palpation. Cardiovascular: Tachycardic rate, regular rhythm. Grossly normal heart sounds.  Good peripheral circulation. Respiratory: Normal respiratory effort.  No retractions. Lungs CTAB.  No distress or tachypnea.  Clear lungs throughout. Gastrointestinal: Soft , nondistended, nontender to palpation. No CVA tenderness. Musculoskeletal: No lower extremity tenderness nor edema.  No joint effusions. No signs of acute trauma. Neurologic:  Normal speech and language. No gross focal neurologic deficits are appreciated. No gait instability noted. Skin:  Skin is warm, dry and intact. No rash noted. Psychiatric: Mood and affect are normal. Speech and behavior are normal.  ____________________________________________   LABS (all labs ordered are listed, but only abnormal results are displayed)  Labs Reviewed  BASIC METABOLIC PANEL - Abnormal; Notable for the following components:      Result Value   Sodium 133 (*)    Potassium 3.1 (*)    Chloride 92 (*)    Glucose, Bld 117 (*)    Creatinine, Ser 1.08 (*)    All other components within normal limits  CBC - Abnormal; Notable for the following components:   RBC 5.84 (*)    Hemoglobin 16.8 (*)    HCT 48.8 (*)    All other components within normal limits  RESP PANEL BY RT-PCR (FLU A&B, COVID) ARPGX2  POC SARS CORONAVIRUS 2 AG -  ED  POC URINE PREG, ED  TROPONIN I (HIGH SENSITIVITY)  TROPONIN I (HIGH SENSITIVITY)   ____________________________________________  12 Lead EKG  Sinus rhythm, rate of 113 bpm.  Normal axis.  Normal intervals.  No evidence of acute ischemia. ____________________________________________  RADIOLOGY  ED MD interpretation: 2 view CXR reviewed by me with patchy multifocal infiltrates, with some more discrete lobar filtration to her right upper lobe concerning for pneumonia.  Official radiology report(s): DG  Chest 2 View  Result Date: 10/20/2020 CLINICAL DATA:  Chest pain and shortness of breath. Dizziness and nausea EXAM: CHEST - 2 VIEW COMPARISON:  08/07/2008 FINDINGS: Normal heart size and pulmonary vascularity. Right upper lobe perihilar infiltration, likely pneumonia. There is prominence of the right hilum, progressing since previous study. This could represent lymphadenopathy or hilar mass. Follow-up after resolution of the acute process is recommended to exclude hilar mass with postobstructive change. No pleural effusions. No pneumothorax. Visualized bones appear intact. IMPRESSION: Right upper lobe perihilar infiltration, likely pneumonia. Prominent right hilum, progressing since previous study. Follow-up to resolution is recommended to exclude hilar mass. Electronically Signed   By: Burman Nieves M.D.   On: 10/20/2020 15:31    ____________________________________________   PROCEDURES and INTERVENTIONS  Procedure(s) performed (including Critical Care):  .1-3 Lead EKG Interpretation Performed by: Delton Prairie, MD Authorized by: Delton Prairie, MD     Interpretation: normal     ECG rate:  94   ECG rate assessment: normal     Rhythm: sinus rhythm     Ectopy: none     Conduction: normal      Medications  sodium chloride 0.9 % bolus 1,000 mL (1,000 mLs Intravenous New Bag/Given 10/20/20 1740)  potassium  chloride SA (KLOR-CON) CR tablet 40 mEq (40 mEq Oral Given 10/20/20 1726)  acetaminophen (TYLENOL) tablet 1,000 mg (1,000 mg Oral Given 10/20/20 1726)  doxycycline (VIBRA-TABS) tablet 100 mg (100 mg Oral Given 10/20/20 1828)    ____________________________________________   MDM / ED COURSE   49 year old woman with unknown hypercoagulable disorder presents to the ED with chest pain in the setting of COVID-19, ultimately amenable to outpatient management.  Patient presented with mild sinus tachycardia, resolving after IV fluids, and vitals are otherwise normal on room air.  Exam  without evidence of significant derangements.  She is in no distress, has no evidence of neurovascular deficits .  She has dry mucous membranes and initial tachycardia, suggestive of dehydration.  Considering her constellation of symptoms and positive home antigen test, I suspect her symptoms are attributable to COVID-19.  We discussed the possibility of an acute PE, and she is agreeable to not pursued CTA chest at this time as her symptoms are more attributable to COVID-19 and she has not missed any Eliquis.  Patient reports resolving symptoms after IV fluids, potassium repletion and Tylenol.  Her CXR has some localized opacity to her RUL and we discussed empiric treatment with doxycycline for the possibility of community-acquired pneumonia, and she is agreeable.  Covid PCR test pending at the time of discharge, requested by patient.  We discussed return precautions for the ED and patient is medically stable for discharge home.   Clinical Course as of 10/20/20 1918  Thu Oct 20, 2020  1730 We discussed her risk threshold and she indicates that she does not "need" a CTA chest at this time to rule out PE, she indicates that she "just wanted to make sure my heart was okay."  We discussed symptom control for her likely COVID-19 symptoms, and if persistent tachycardia or symptoms after fluids, potassium supplementation and Tylenol, we would likely pursue CTA chest at that point. [DS]  1917 Reassessed.  We discussed awaiting Covid PCR test.  She reports feeling well and is eager to go home.  We again discussed the possibility of acute PE, and she is agreeable with no CTA imaging of her chest.  We discussed return precautions for the ED.  We discussed following up on her MyChart results for her Covid test results. [DS]    Clinical Course User Index [DS] Delton Prairie, MD    ____________________________________________   FINAL CLINICAL IMPRESSION(S) / ED DIAGNOSES  Final diagnoses:  Pneumonia of right upper  lobe due to infectious organism  COVID     ED Discharge Orders         Ordered    doxycycline (VIBRA-TABS) 100 MG tablet  2 times daily        10/20/20 1809           Thania Woodlief Katrinka Blazing   Note:  This document was prepared using Dragon voice recognition software and may include unintentional dictation errors.   Delton Prairie, MD 10/20/20 Ernestina [place]

## 2020-10-26 ENCOUNTER — Telehealth: Payer: Self-pay | Admitting: *Deleted

## 2020-10-26 NOTE — Telephone Encounter (Signed)
Left message on Pam's voice mail that per Dr Smith Robert  ok to switch to Xarelto

## 2020-10-26 NOTE — Telephone Encounter (Signed)
Call from patient PCP Dr Aldridge's office Nurse Elita Quick reporting that patient insurance will not cover Eliquis and prefers Xarelto. Dr Dayna Barker does not want to make this changes without discussing with Dr Smith Robert first. Patient does not have another appointment with Dr Smith Robert until mid 2022. Please return call to Dr Aldridge's office

## 2020-10-26 NOTE — Telephone Encounter (Signed)
Steward Drone- can you please let their office know I am ok with switching her to xarelto?

## 2020-11-11 ENCOUNTER — Ambulatory Visit
Admission: RE | Admit: 2020-11-11 | Discharge: 2020-11-11 | Disposition: A | Discharge status: Home / Self Care | Payer: BC Managed Care – PPO | Source: Ambulatory Visit | Attending: Family Medicine | Admitting: Family Medicine

## 2020-11-11 ENCOUNTER — Other Ambulatory Visit: Payer: Self-pay | Admitting: Family Medicine

## 2020-11-11 ENCOUNTER — Ambulatory Visit
Admission: RE | Admit: 2020-11-11 | Discharge: 2020-11-11 | Disposition: A | Discharge status: Home / Self Care | Payer: BC Managed Care – PPO | Attending: Family Medicine | Admitting: Family Medicine

## 2020-11-11 DIAGNOSIS — J1282 Pneumonia due to coronavirus disease 2019: Secondary | ICD-10-CM

## 2021-03-16 ENCOUNTER — Telehealth: Payer: Self-pay | Admitting: Oncology

## 2021-03-16 NOTE — Telephone Encounter (Signed)
Left VM with patient to notify her of appointment on 6/7 that has been rescheduled. Mailing appt reminder.

## 2021-03-28 ENCOUNTER — Other Ambulatory Visit: Payer: BC Managed Care – PPO

## 2021-03-28 ENCOUNTER — Ambulatory Visit: Payer: BC Managed Care – PPO | Admitting: Oncology

## 2021-04-18 ENCOUNTER — Inpatient Hospital Stay: Payer: BC Managed Care – PPO

## 2021-04-18 ENCOUNTER — Inpatient Hospital Stay: Payer: BC Managed Care – PPO | Admitting: Oncology

## 2022-07-17 ENCOUNTER — Other Ambulatory Visit: Payer: Self-pay | Admitting: Family Medicine

## 2022-07-17 DIAGNOSIS — Z1231 Encounter for screening mammogram for malignant neoplasm of breast: Secondary | ICD-10-CM

## 2022-08-28 IMAGING — US US ABDOMEN COMPLETE
1 series · 14 of 25 positions shown · non-contrast
Comparison: August 25, 2009.

CLINICAL DATA: Acute right upper quadrant abdominal pain.

EXAM:
ABDOMEN ULTRASOUND COMPLETE

[Series 1: us abdomen complete · 14 of 80 slices shown]
[im 1/80]
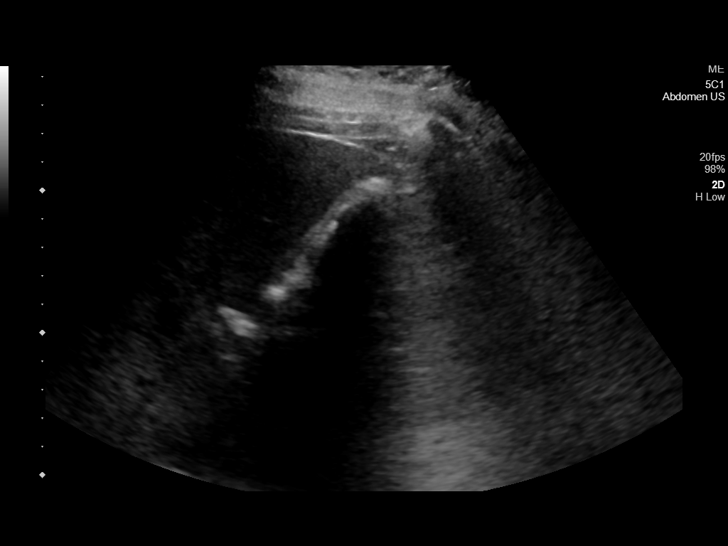
[im 7/80]
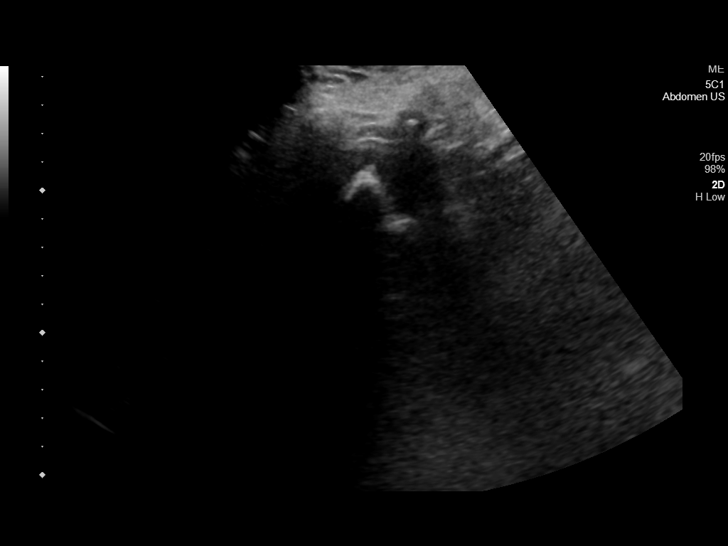
[im 14/80]
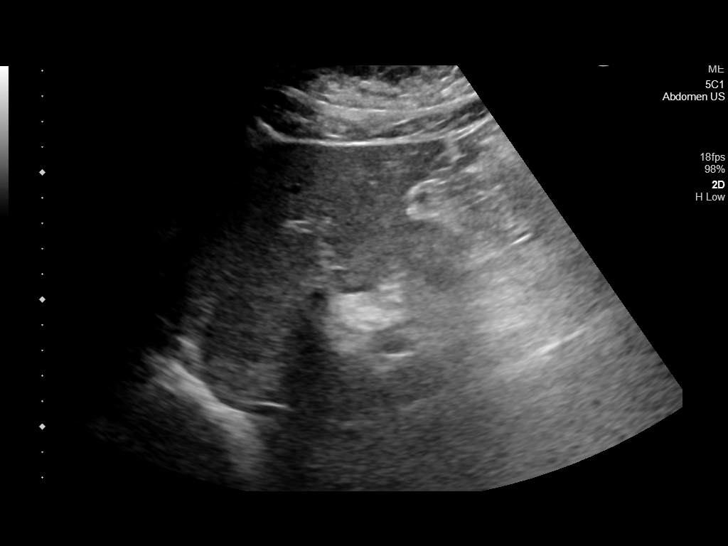
[im 20/80]
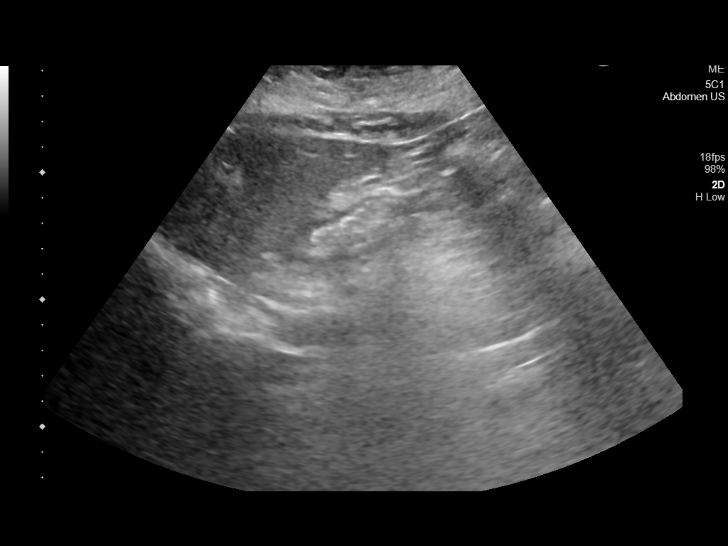
[im 27/80]
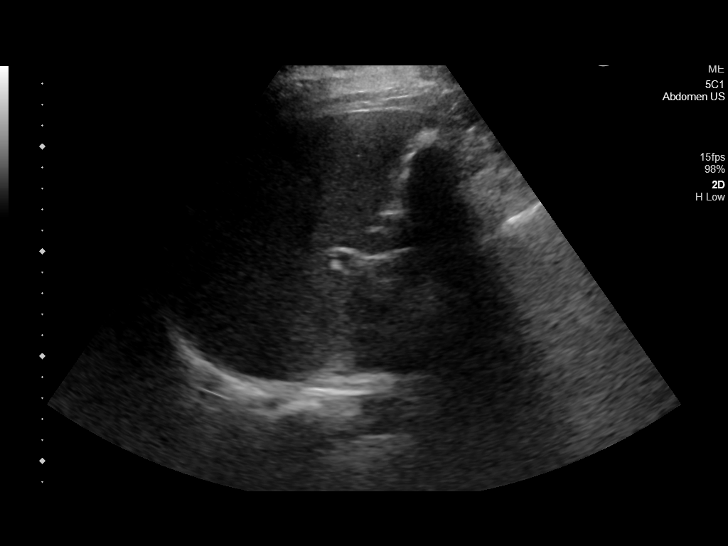
[im 30/80]
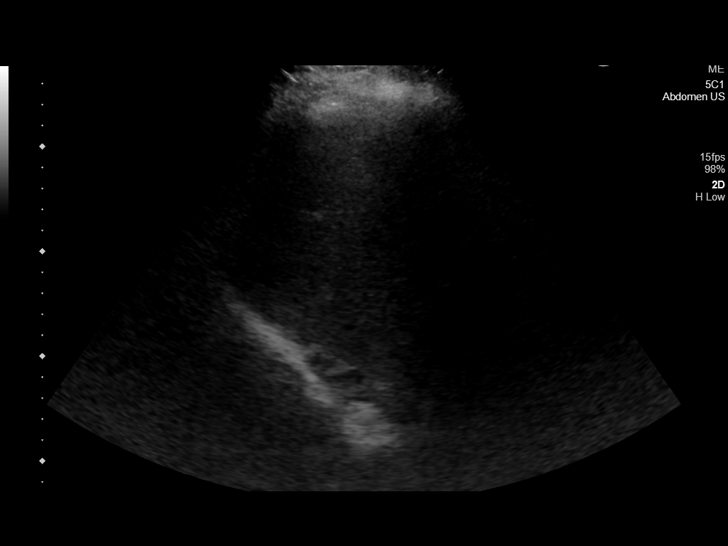
[im 37/80]
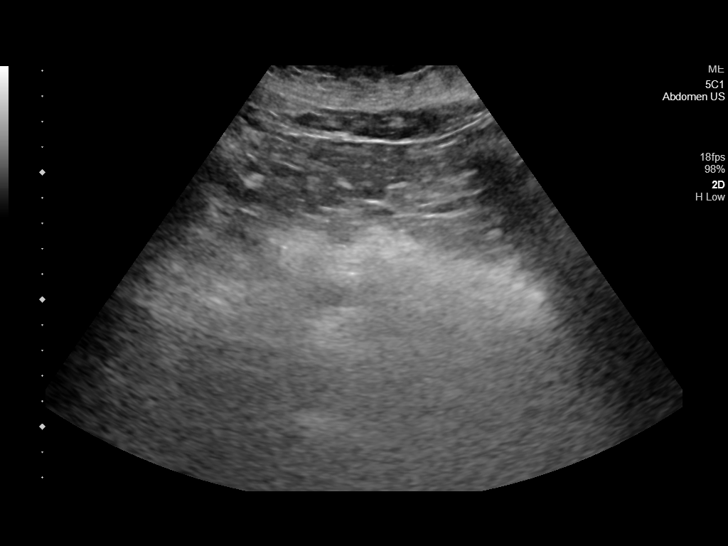
[im 43/80]
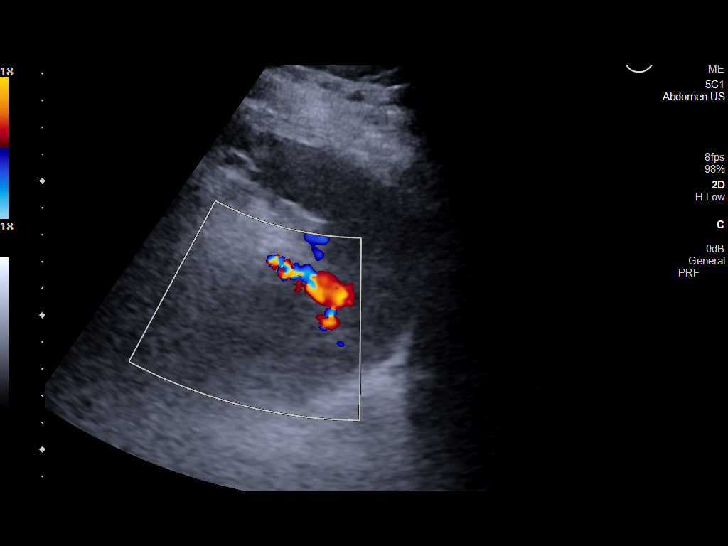
[im 50/80]
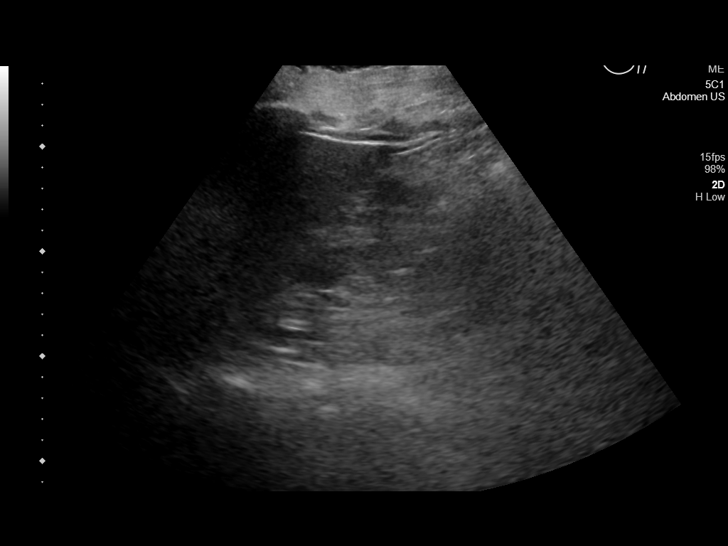
[im 53/80]
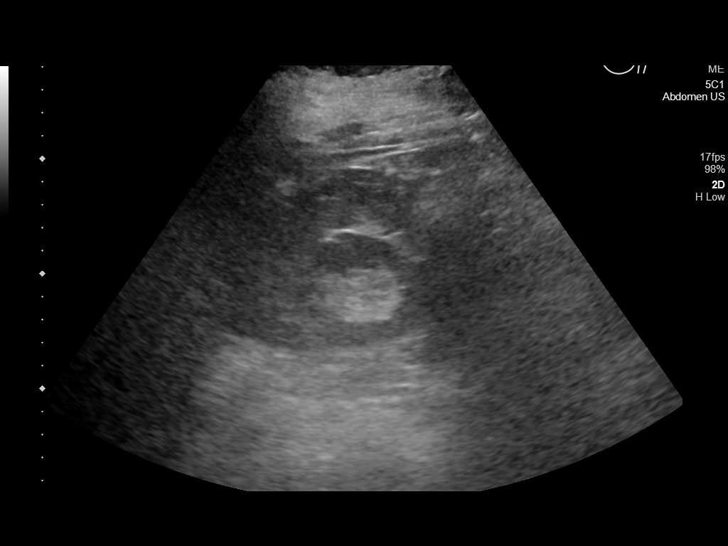
[im 60/80]
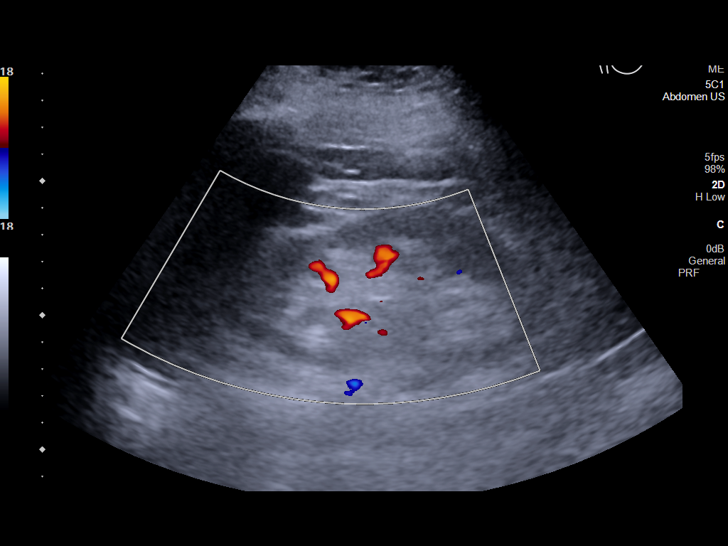
[im 66/80]
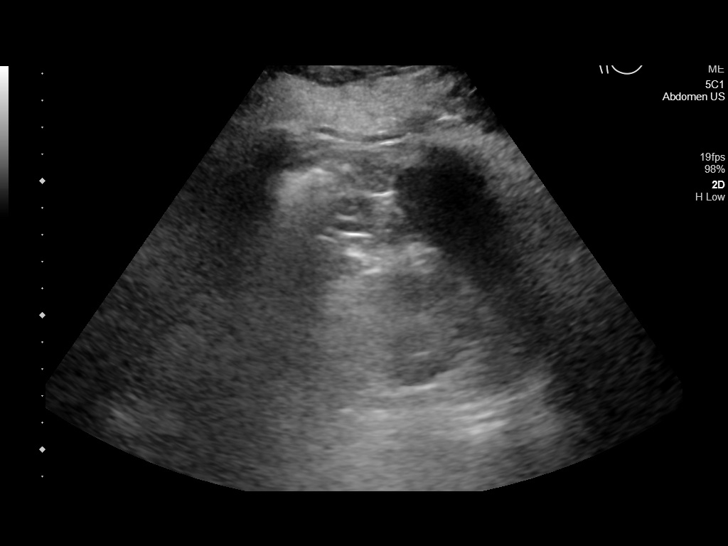
[im 73/80]
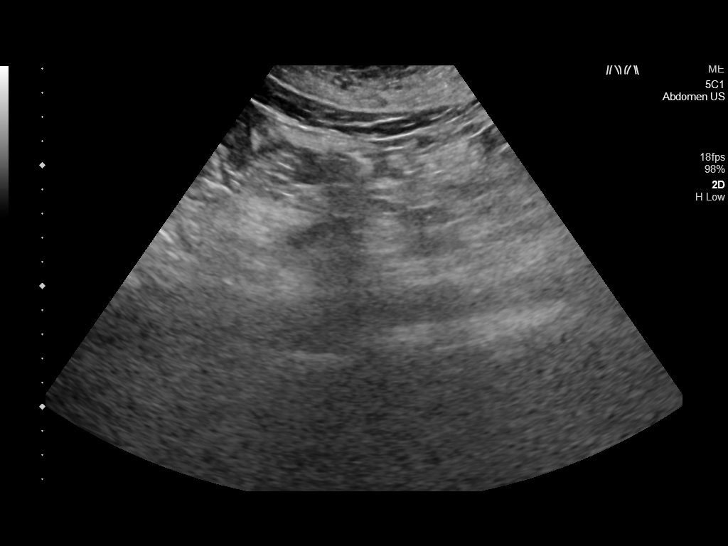
[im 80/80]
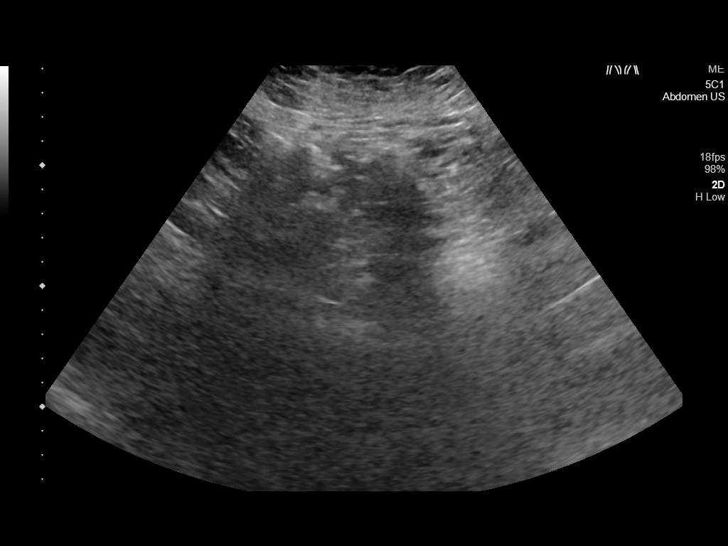

[14 of 25 positions shown; findings below may reference images not displayed]

FINDINGS: Gallbladder: Cholelithiasis is noted without gallbladder wall
thickening or pericholecystic fluid. No sonographic Murphy's sign is
noted.

Common bile duct: Diameter: 7 mm which is within normal limits.

Liver: No focal lesion identified. Within normal limits in
parenchymal echogenicity. Portal vein is patent on color Doppler
imaging with normal direction of blood flow towards the liver.

IVC: No abnormality visualized.

Pancreas: Visualized portion appears unremarkable.

Spleen: Size and appearance within normal limits.

Right Kidney: Length: 12.4 cm. Echogenicity within normal limits. No
mass or hydronephrosis visualized.

Left Kidney: Length: 12 cm. Echogenicity within normal limits. No
mass or hydronephrosis visualized.

Abdominal aorta: No aneurysm visualized.

Other findings: None.
IMPRESSION: Cholelithiasis without evidence of cholecystitis. No other definite
abnormality seen in the abdomen.

## 2022-10-12 ENCOUNTER — Encounter: Payer: Self-pay | Admitting: Internal Medicine

## 2022-10-17 ENCOUNTER — Ambulatory Visit
Admission: RE | Admit: 2022-10-17 | Discharge: 2022-10-17 | Disposition: A | Discharge status: Home / Self Care | Payer: BC Managed Care – PPO | Attending: Internal Medicine | Admitting: Internal Medicine

## 2022-10-17 ENCOUNTER — Encounter: Payer: Self-pay | Admitting: Internal Medicine

## 2022-10-17 ENCOUNTER — Encounter
Admission: RE | Disposition: A | Discharge status: Home / Self Care | Payer: Self-pay | Source: Home / Self Care | Attending: Internal Medicine

## 2022-10-17 ENCOUNTER — Ambulatory Visit: Payer: BC Managed Care – PPO | Admitting: General Practice

## 2022-10-17 DIAGNOSIS — K640 First degree hemorrhoids: Secondary | ICD-10-CM | POA: Diagnosis not present

## 2022-10-17 DIAGNOSIS — Z1211 Encounter for screening for malignant neoplasm of colon: Secondary | ICD-10-CM | POA: Diagnosis not present

## 2022-10-17 DIAGNOSIS — I10 Essential (primary) hypertension: Secondary | ICD-10-CM | POA: Insufficient documentation

## 2022-10-17 HISTORY — PX: COLONOSCOPY: SHX5424

## 2022-10-17 SURGERY — COLONOSCOPY
Anesthesia: General

## 2022-10-17 MED ORDER — PROPOFOL 10 MG/ML IV BOLUS
INTRAVENOUS | Status: DC | PRN
  Administered 2022-10-17: 120 mg via INTRAVENOUS

## 2022-10-17 MED ORDER — PROPOFOL 500 MG/50ML IV EMUL
INTRAVENOUS | Status: DC | PRN
  Administered 2022-10-17: 189.961 ug/kg/min via INTRAVENOUS

## 2022-10-17 MED ORDER — PHENYLEPHRINE HCL (PRESSORS) 10 MG/ML IV SOLN
INTRAVENOUS | Status: DC | PRN
  Administered 2022-10-17 (×2): 160 ug via INTRAVENOUS

## 2022-10-17 MED ORDER — PHENYLEPHRINE 80 MCG/ML (10ML) SYRINGE FOR IV PUSH (FOR BLOOD PRESSURE SUPPORT)
PREFILLED_SYRINGE | INTRAVENOUS | Status: AC
  Filled 2022-10-17: qty 10

## 2022-10-17 MED ORDER — LIDOCAINE HCL (CARDIAC) PF 100 MG/5ML IV SOSY
PREFILLED_SYRINGE | INTRAVENOUS | Status: DC | PRN
  Administered 2022-10-17: 100 mg via INTRAVENOUS

## 2022-10-17 MED ORDER — SODIUM CHLORIDE 0.9 % IV SOLN: INTRAVENOUS | Status: DC

## 2022-10-17 NOTE — Anesthesia Preprocedure Evaluation (Signed)
Anesthesia Evaluation  Patient identified by MRN, date of birth, ID band Patient awake    Reviewed: Allergy & Precautions, NPO status , Patient's Chart, lab work & pertinent test results  History of Anesthesia Complications Negative for: history of anesthetic complications  Airway Mallampati: III  TM Distance: >3 FB Neck ROM: full    Dental  (+) Dental Advidsory Given   Pulmonary neg shortness of breath, sleep apnea and Continuous Positive Airway Pressure Ventilation , neg COPD   Pulmonary exam normal        Cardiovascular hypertension, (-) angina (-) Past MI and (-) CABG negative cardio ROS Normal cardiovascular exam     Neuro/Psych negative neurological ROS  negative psych ROS   GI/Hepatic negative GI ROS, Neg liver ROS,,,  Endo/Other  Hypothyroidism    Renal/GU negative Renal ROS  negative genitourinary   Musculoskeletal   Abdominal   Peds  Hematology negative hematology ROS (+)   Anesthesia Other Findings Past Medical History: No date: Anemia 03/2017: Gout No date: History of blood clots No date: Hypertension No date: Stroke (HCC) No date: Thyroid disease  Past Surgical History: No date: COLONOSCOPY No date: ESOPHAGOGASTRODUODENOSCOPY  BMI    Body Mass Index: 45.49 kg/m      Reproductive/Obstetrics negative OB ROS                             Anesthesia Physical Anesthesia Plan  ASA: 3  Anesthesia Plan: General   Post-op Pain Management:    Induction: Intravenous  PONV Risk Score and Plan: Propofol infusion and TIVA  Airway Management Planned: Natural Airway and Nasal Cannula  Additional Equipment:   Intra-op Plan:   Post-operative Plan:   Informed Consent: I have reviewed the patients History and Physical, chart, labs and discussed the procedure including the risks, benefits and alternatives for the proposed anesthesia with the patient or authorized  representative who has indicated his/her understanding and acceptance.     Dental Advisory Given  Plan Discussed with: Anesthesiologist, CRNA and Surgeon  Anesthesia Plan Comments: (Patient consented for risks of anesthesia including but not limited to:  - adverse reactions to medications - risk of airway placement if required - damage to eyes, teeth, lips or other oral mucosa - nerve damage due to positioning  - sore throat or hoarseness - Damage to heart, brain, nerves, lungs, other parts of body or loss of life  Patient voiced understanding.)        Anesthesia Quick Evaluation

## 2022-10-17 NOTE — H&P (Signed)
Outpatient short stay form Pre-procedure 10/17/2022 12:47 PM Caitlyn Elliott, M.D.  Primary Physician: Leim Fabry, M.D.  Reason for visit:  Colon cancer screening  History of present illness:  Patient presents for colonoscopy for colon cancer screening. The patient denies complaints of abdominal pain, significant change in bowel habits, or rectal bleeding.      Current Facility-Administered Medications:    0.9 %  sodium chloride infusion, , Intravenous, Continuous, [place], Boykin Nearing, MD, Last Rate: 20 mL/hr at 10/17/22 1242, New Bag at 10/17/22 1242  Medications Prior to Admission  Medication Sig Dispense Refill Last Dose   levothyroxine (SYNTHROID, LEVOTHROID) 112 MCG tablet TAKE 1 TABLET BY MOUTH ONCE DAILY   10/17/2022 at 0500   triamterene-hydrochlorothiazide (DYAZIDE) 37.5-25 MG per capsule Take 1 capsule by mouth daily.    10/17/2022 at 0500   allopurinol (ZYLOPRIM) 100 MG tablet Take 100 mg by mouth daily.      allopurinol (ZYLOPRIM) 300 MG tablet Take 300 mg daily by mouth.      apixaban (ELIQUIS) 5 MG TABS tablet Take 5 mg by mouth 2 (two) times daily.   10/15/2022   atorvastatin (LIPITOR) 10 MG tablet Take 10 mg by mouth daily.        Allergies  Allergen Reactions   Iron Rash    Slow FE iron OTC [facility] brand     Past Medical History:  Diagnosis Date   Anemia    Gout 03/2017   History of blood clots    Hypertension    Stroke [facility])    Thyroid disease     Review of systems:  Otherwise negative.    Physical Exam  Gen: Alert, oriented. Appears stated age.  HEENT: [place]/AT. PERRLA. Lungs: CTA, no wheezes. CV: RR nl S1, S2. Abd: soft, benign, no masses. BS+ Ext: No edema. Pulses 2+    Planned procedures: Proceed with colonoscopy. The patient understands the nature of the planned procedure, indications, risks, alternatives and potential complications including but not limited to bleeding, infection, perforation, damage to internal organs and  possible oversedation/side effects from anesthesia. The patient agrees and gives consent to proceed.  Please refer to procedure notes for findings, recommendations and patient disposition/instructions.     Klaryssa Fauth K. Norma Elliott, M.D. Gastroenterology 10/17/2022  12:47 PM

## 2022-10-17 NOTE — Interval H&P Note (Signed)
History and Physical Interval Note:  10/17/2022 12:48 PM  Caitlyn Elliott  has presented today for surgery, with the diagnosis of Colon cancer screening (Z12.11).  The various methods of treatment have been discussed with the patient and family. After consideration of risks, benefits and other options for treatment, the patient has consented to  Procedure(s): COLONOSCOPY (N/A) as a surgical intervention.  The patient's history has been reviewed, patient examined, no change in status, stable for surgery.  I have reviewed the patient's chart and labs.  Questions were answered to the patient's satisfaction.     [place], [place]

## 2022-10-17 NOTE — Op Note (Signed)
[facility] Patient Name: Caitlyn Elliott Procedure Date: 10/17/2022 1:40 PM MRN: [id number] Account #: [id number] Date of Birth: [birth year] Admit Type: Outpatient Age: 51 Room: [facility] ENDO ROOM 2 Gender: Female Note Status: Finalized Instrument Name: Jasper Riling [id number] Procedure:             Colonoscopy Indications:           Screening for colorectal malignant neoplasm Providers:             Lorie Apley K. Kenedy Haisley MD, MD Medicines:             Propofol per Anesthesia Complications:         No immediate complications. Procedure:             Pre-Anesthesia Assessment:                        - The risks and benefits of the procedure and the                         sedation options and risks were discussed with the                         patient. All questions were answered and informed                         consent was obtained.                        - Patient identification and proposed procedure were                         verified prior to the procedure by the nurse. The                         procedure was verified in the procedure room.                        - ASA Grade Assessment: III - A patient with severe                         systemic disease.                        - After reviewing the risks and benefits, the patient                         was deemed in satisfactory condition to undergo the                         procedure.                        After obtaining informed consent, the colonoscope was                         passed under direct vision. Throughout the procedure,                         the patient's blood pressure, pulse, and oxygen  saturations were monitored continuously. The                         Colonoscope was introduced through the anus and                         advanced to the the cecum, identified by appendiceal                         orifice and ileocecal valve. The colonoscopy was                          performed without difficulty. The patient tolerated                         the procedure well. The quality of the bowel                         preparation was good. The ileocecal valve, appendiceal                         orifice, and rectum were photographed. Findings:      The perianal and digital rectal examinations were normal. Pertinent       negatives include normal sphincter tone and no palpable rectal lesions.      Non-bleeding internal hemorrhoids were found during retroflexion. The       hemorrhoids were Grade I (internal hemorrhoids that do not prolapse).      The colon (entire examined portion) appeared normal. Impression:            - Non-bleeding internal hemorrhoids.                        - The entire examined colon is normal.                        - No specimens collected. Recommendation:        - Patient has a contact number available for                         emergencies. The signs and symptoms of potential                         delayed complications were discussed with the patient.                         Return to normal activities tomorrow. Written                         discharge instructions were provided to the patient.                        - Resume previous diet.                        - Continue present medications.                        - Repeat colonoscopy in 10 years for screening  purposes.                        - Return to GI office PRN.                        - The findings and recommendations were discussed with                         the patient. Procedure Code(s):     --- Professional ---                        [id number], Colorectal cancer screening; colonoscopy on                         individual not meeting criteria for high risk Diagnosis Code(s):     --- Professional ---                        K64.0, First degree hemorrhoids                        Z12.11, Encounter for screening for malignant  neoplasm                         of colon CPT copyright 2022 [employer]. All rights reserved. The codes documented in this report are preliminary and upon coder review may  be revised to meet current compliance requirements. Efrain Sella MD, MD 10/17/2022 2:10:01 PM This report has been signed electronically. Number of Addenda: 0 Note Initiated On: 10/17/2022 1:40 PM Scope Withdrawal Time: 0 hours 4 minutes 39 seconds  Total Procedure Duration: 0 hours 10 minutes 17 seconds  Estimated Blood Loss:  Estimated blood loss: none.      [facility]

## 2022-10-17 NOTE — Transfer of Care (Signed)
Immediate Anesthesia Transfer of Care Note  Patient: Caitlyn Elliott  Procedure(s) Performed: COLONOSCOPY  Patient Location: Endoscopy Unit  Anesthesia Type:General  Level of Consciousness: drowsy  Airway & Oxygen Therapy: Patient Spontanous Breathing  Post-op Assessment: Report given to RN and Post -op Vital signs reviewed and stable  Post vital signs: Reviewed and stable  Last Vitals:  Vitals Value Taken Time  BP 102/68 10/17/22 1413  Temp 36.2 C 10/17/22 1413  Pulse 93 10/17/22 1415  Resp 21 10/17/22 1415  SpO2 98 % 10/17/22 1415  Vitals shown include unvalidated device data.  Last Pain:  Vitals:   10/17/22 1413  TempSrc: Temporal  PainSc: 0-No pain         Complications: No notable events documented.

## 2022-10-18 ENCOUNTER — Encounter: Payer: Self-pay | Admitting: Internal Medicine

## 2022-10-18 NOTE — Anesthesia Postprocedure Evaluation (Signed)
Anesthesia Post Note  Patient: Caitlyn Elliott  Procedure(s) Performed: COLONOSCOPY  Patient location during evaluation: PACU Anesthesia Type: General Level of consciousness: awake and alert Pain management: pain level controlled Vital Signs Assessment: post-procedure vital signs reviewed and stable Respiratory status: spontaneous breathing, nonlabored ventilation, respiratory function stable and patient connected to nasal cannula oxygen Cardiovascular status: blood pressure returned to baseline and stable Postop Assessment: no apparent nausea or vomiting Anesthetic complications: no  No notable events documented.   Last Vitals:  Vitals:   10/17/22 1423 10/17/22 1433  BP: 103/76 111/76  Pulse: 82 81  Resp: 12 18  Temp:    SpO2: 98% 100%    Last Pain:  Vitals:   10/18/22 0746  TempSrc:   PainSc: 0-No pain                 Stephanie Coup

## 2022-10-23 ENCOUNTER — Ambulatory Visit
Admission: RE | Admit: 2022-10-23 | Discharge: 2022-10-23 | Disposition: A | Discharge status: Home / Self Care | Payer: BC Managed Care – PPO | Source: Ambulatory Visit | Attending: Family Medicine | Admitting: Family Medicine

## 2022-10-23 DIAGNOSIS — Z1231 Encounter for screening mammogram for malignant neoplasm of breast: Secondary | ICD-10-CM | POA: Insufficient documentation

## 2022-10-25 ENCOUNTER — Other Ambulatory Visit: Payer: Self-pay | Admitting: Physician Assistant

## 2022-10-25 DIAGNOSIS — N6489 Other specified disorders of breast: Secondary | ICD-10-CM

## 2022-10-25 DIAGNOSIS — R928 Other abnormal and inconclusive findings on diagnostic imaging of breast: Secondary | ICD-10-CM

## 2022-10-26 ENCOUNTER — Ambulatory Visit
Admission: RE | Admit: 2022-10-26 | Discharge: 2022-10-26 | Disposition: A | Discharge status: Home / Self Care | Payer: BC Managed Care – PPO | Source: Ambulatory Visit | Attending: Physician Assistant | Admitting: Physician Assistant

## 2022-10-26 DIAGNOSIS — N6489 Other specified disorders of breast: Secondary | ICD-10-CM

## 2022-10-26 DIAGNOSIS — R928 Other abnormal and inconclusive findings on diagnostic imaging of breast: Secondary | ICD-10-CM

## 2022-11-06 IMAGING — CR DG CHEST 2V
2 series · 2 of 2 positions shown · non-contrast
Comparison: 10/20/2020

CLINICAL DATA: Previous 6GP12-G1, previous abnormal chest x-ray

EXAM:
CHEST - 2 VIEW

[chest pa]
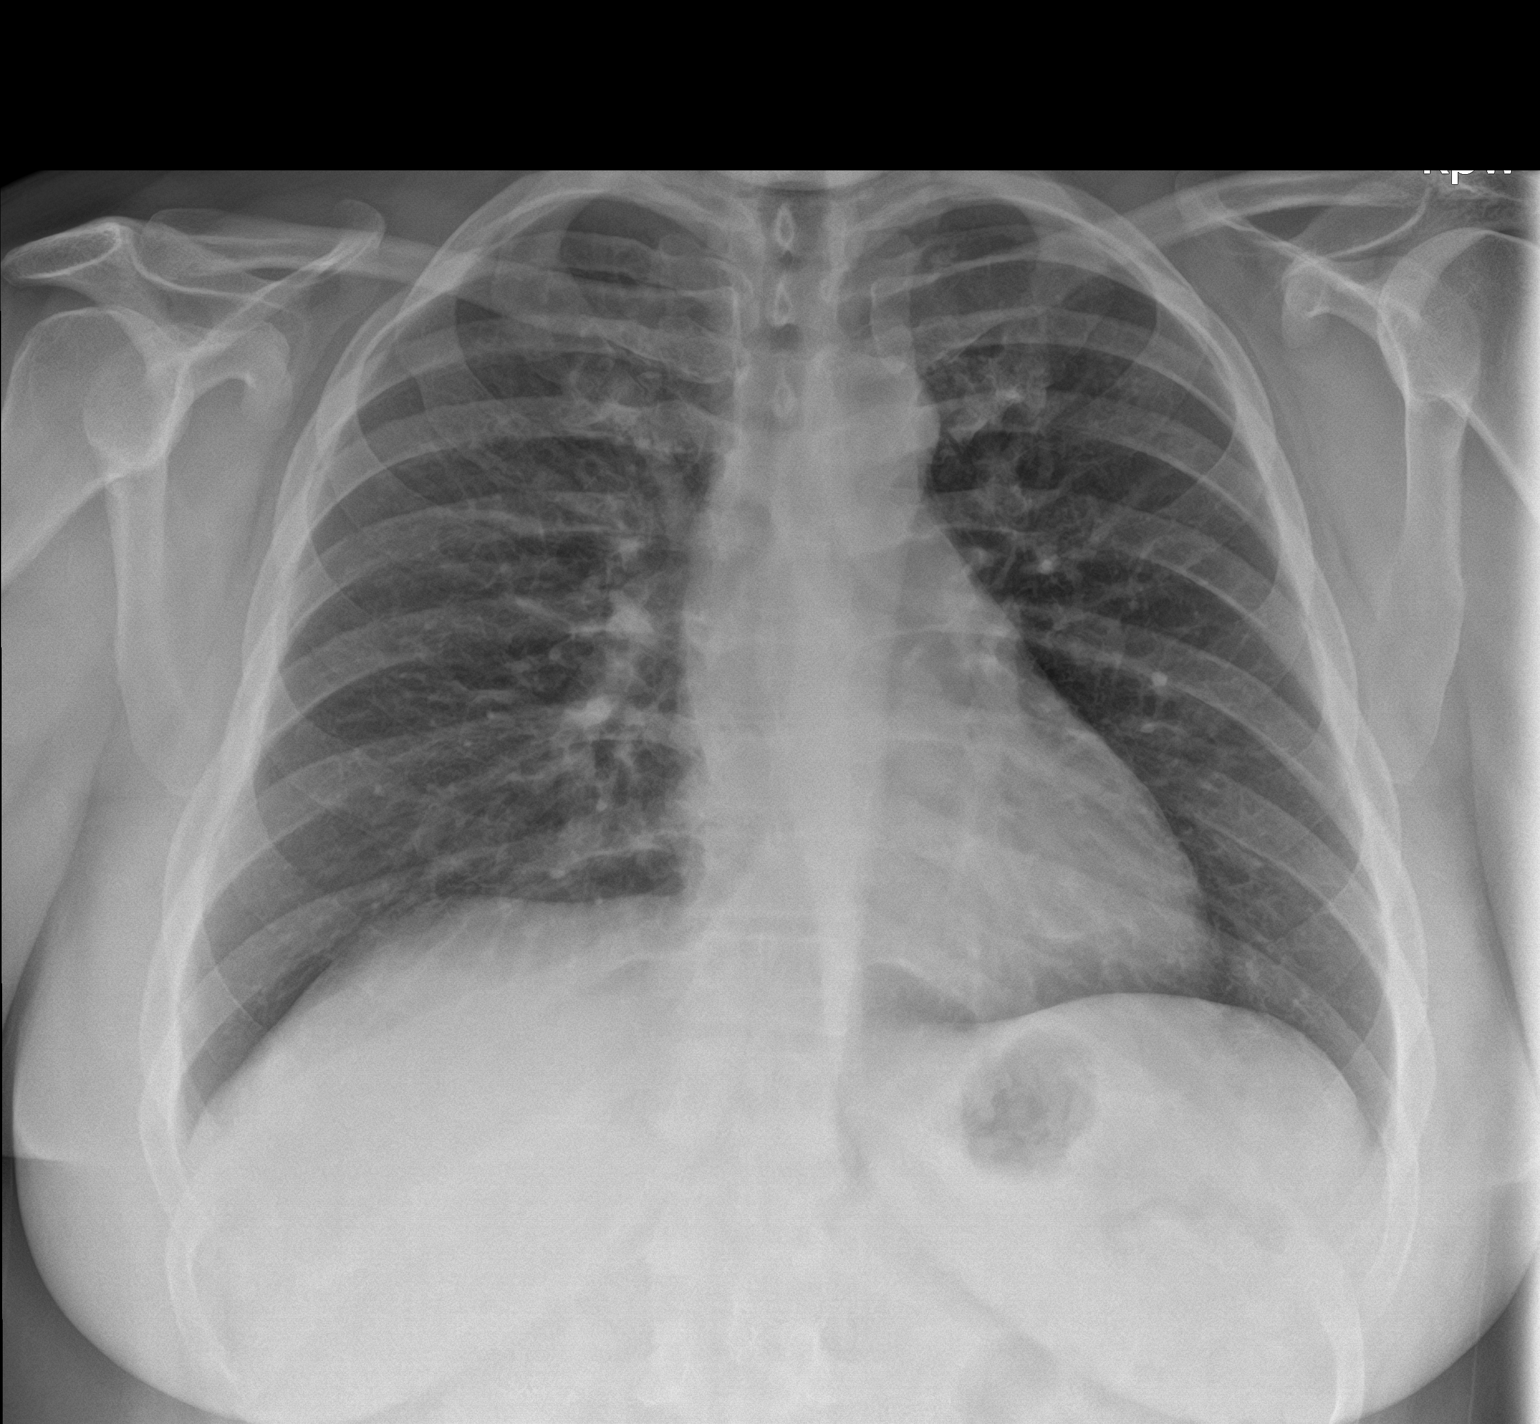

[chest lat]
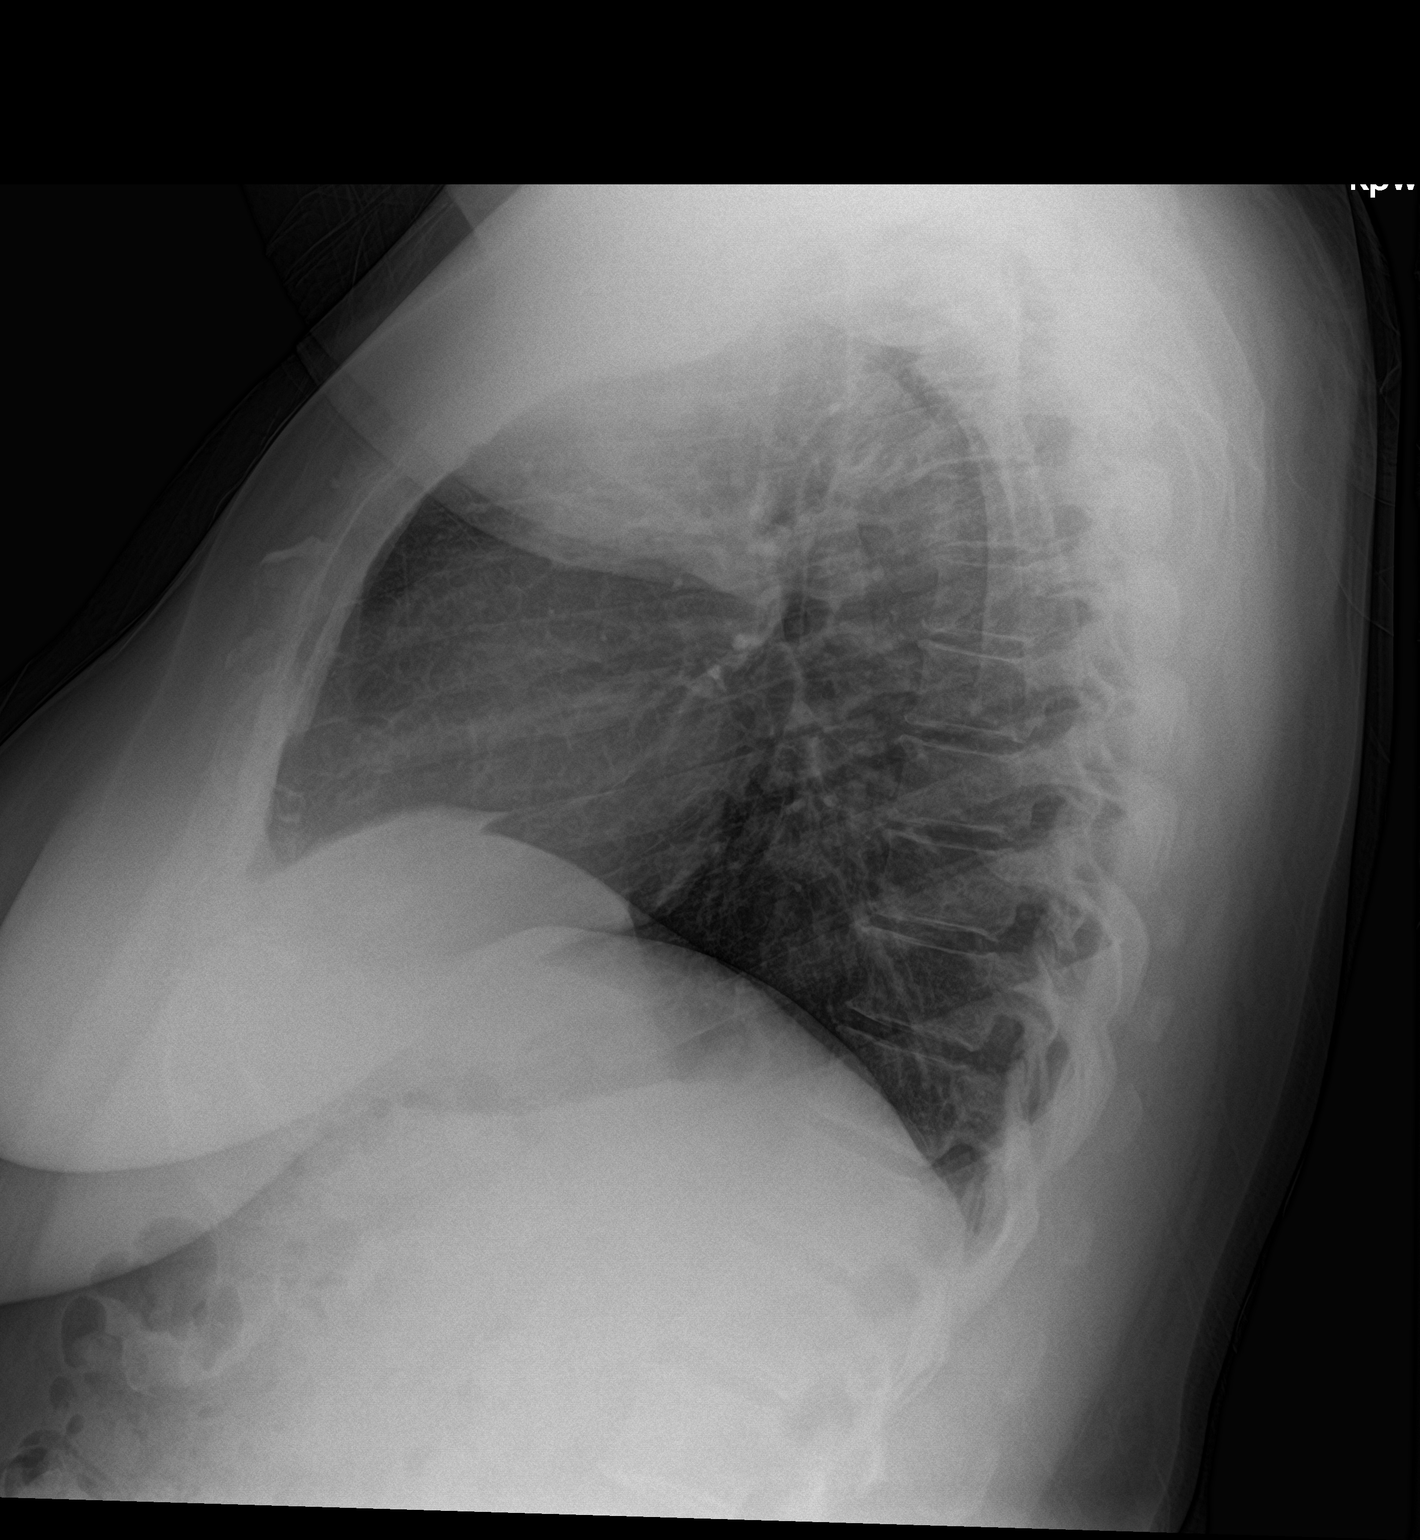

[2 of 2 positions shown; findings below may reference images not displayed]

FINDINGS: Frontal and lateral views of the chest demonstrate an unremarkable
cardiac silhouette. No airspace disease, effusion, or pneumothorax.
Interval resolution of the right upper lobe consolidation seen
previously. No acute bony abnormalities.
IMPRESSION: 1. No acute intrathoracic process.

## 2022-11-12 ENCOUNTER — Other Ambulatory Visit: Payer: BC Managed Care – PPO
